# Patient Record
Sex: Male | Born: 1970 | Race: White | Hispanic: No | Marital: Married | Smoking: Never smoker
Health system: Southern US, Community
[De-identification: ages and names within clinical notes are randomized; demographics above are authoritative.]

## PROBLEM LIST (undated history)

## (undated) DIAGNOSIS — F329 Major depressive disorder, single episode, unspecified: Secondary | ICD-10-CM

## (undated) DIAGNOSIS — I10 Essential (primary) hypertension: Secondary | ICD-10-CM

## (undated) DIAGNOSIS — F32A Depression, unspecified: Secondary | ICD-10-CM

## (undated) DIAGNOSIS — F419 Anxiety disorder, unspecified: Secondary | ICD-10-CM

## (undated) DIAGNOSIS — K219 Gastro-esophageal reflux disease without esophagitis: Secondary | ICD-10-CM

## (undated) DIAGNOSIS — G4730 Sleep apnea, unspecified: Secondary | ICD-10-CM

## (undated) DIAGNOSIS — N189 Chronic kidney disease, unspecified: Secondary | ICD-10-CM

## (undated) HISTORY — PX: COLONOSCOPY: SHX174

## (undated) HISTORY — PX: WISDOM TOOTH EXTRACTION: SHX21

---
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
## 2011-01-06 ENCOUNTER — Ambulatory Visit: Payer: Self-pay | Admitting: Unknown Physician Specialty

## 2011-01-11 LAB — PATHOLOGY REPORT

## 2011-12-31 ENCOUNTER — Ambulatory Visit: Payer: Self-pay

## 2012-01-06 ENCOUNTER — Ambulatory Visit: Payer: Self-pay | Admitting: Internal Medicine

## 2013-05-15 ENCOUNTER — Ambulatory Visit: Payer: Self-pay

## 2013-11-13 ENCOUNTER — Ambulatory Visit: Payer: Self-pay | Admitting: Emergency Medicine

## 2013-11-13 LAB — URINALYSIS, COMPLETE
Bacteria: NEGATIVE
Bilirubin,UR: NEGATIVE
KETONE: NEGATIVE
LEUKOCYTE ESTERASE: NEGATIVE
Nitrite: NEGATIVE
Ph: 6.5 (ref 4.5–8.0)
Protein: NEGATIVE
Specific Gravity: 1.01 (ref 1.003–1.030)

## 2013-11-15 LAB — URINE CULTURE

## 2013-12-09 ENCOUNTER — Ambulatory Visit: Payer: Self-pay | Admitting: Urology

## 2014-01-12 DIAGNOSIS — R972 Elevated prostate specific antigen [PSA]: Secondary | ICD-10-CM | POA: Insufficient documentation

## 2014-01-12 DIAGNOSIS — R310 Gross hematuria: Secondary | ICD-10-CM | POA: Insufficient documentation

## 2014-03-24 DIAGNOSIS — M40204 Unspecified kyphosis, thoracic region: Secondary | ICD-10-CM | POA: Insufficient documentation

## 2014-03-24 DIAGNOSIS — M542 Cervicalgia: Secondary | ICD-10-CM | POA: Insufficient documentation

## 2014-10-15 IMAGING — CT CT ABDOMEN AND PELVIS WITHOUT AND WITH CONTRAST
2 of 7 series · 13 of 46 positions shown, 18 images · IV contrast (isovue)
Comparison: None.

CLINICAL DATA: Painless hematuria 2 weeks ago.

EXAM:
CT ABDOMEN AND PELVIS WITHOUT AND WITH CONTRAST
TECHNIQUE: Multidetector CT imaging of the abdomen and pelvis was performed
without contrast material in one or both body regions, followed by
contrast material(s) and further sections in one or both body
regions.
CONTRAST:  100 ml Isovue 370

[Series 6: hematuria < 45 with · axial · 0.98mm/px · z∈[-882,-447]mm · 10 of 103 slices shown, 15 images]
[im 8/103  soft-tissue]
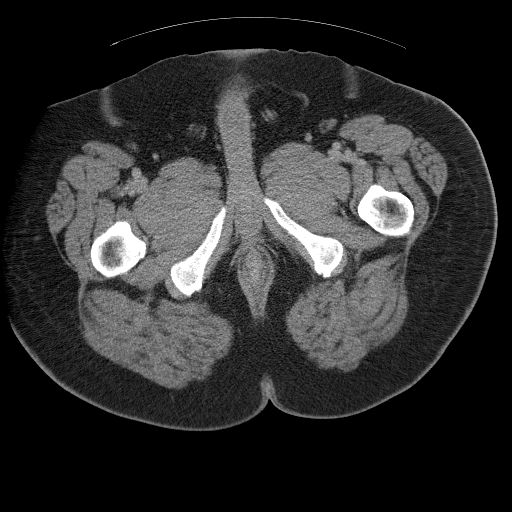
[im 8/103  bone]
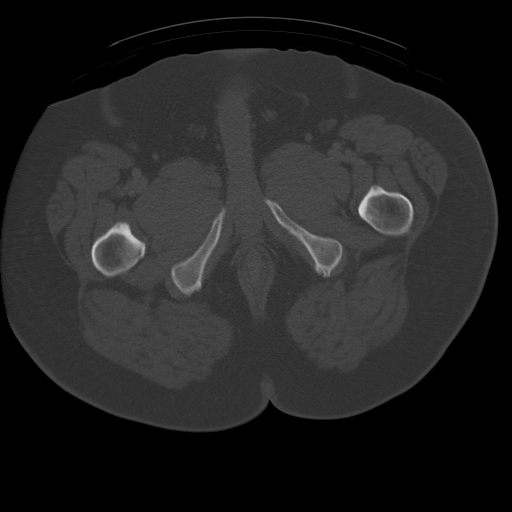
[im 22/103  soft-tissue]
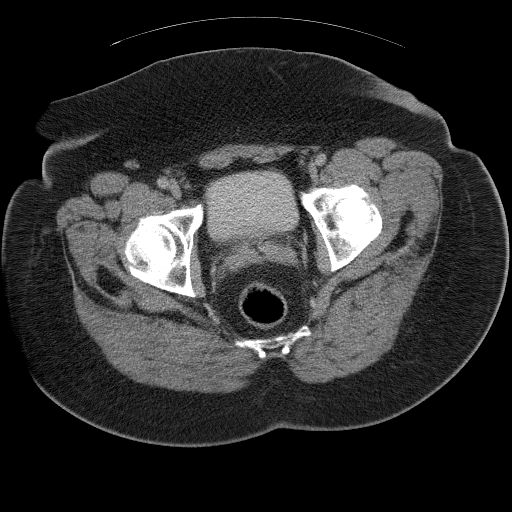
[im 30/103  soft-tissue]
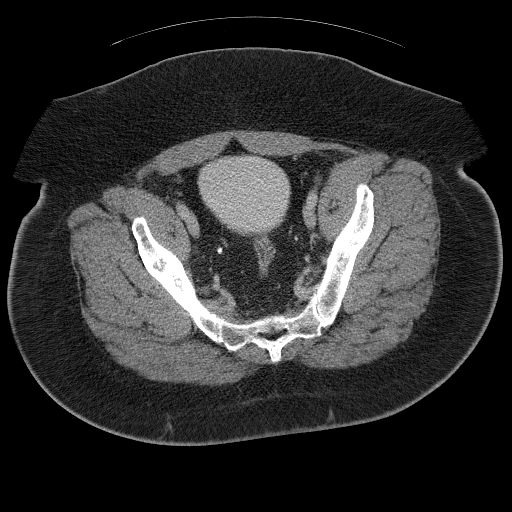
[im 44/103  soft-tissue]
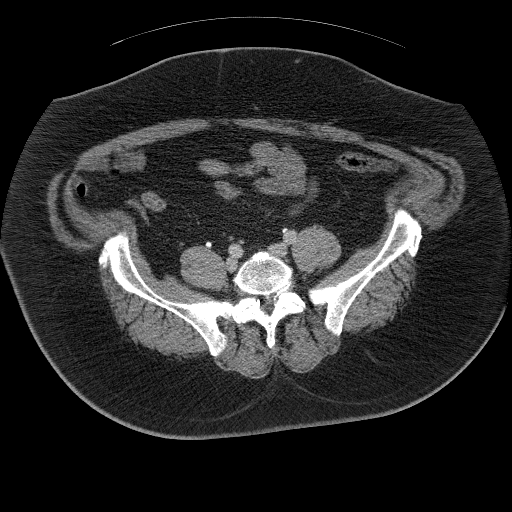
[im 52/103  soft-tissue]
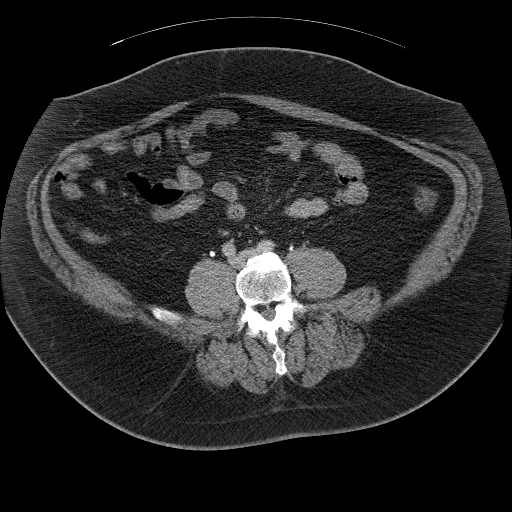
[im 59/103  soft-tissue]
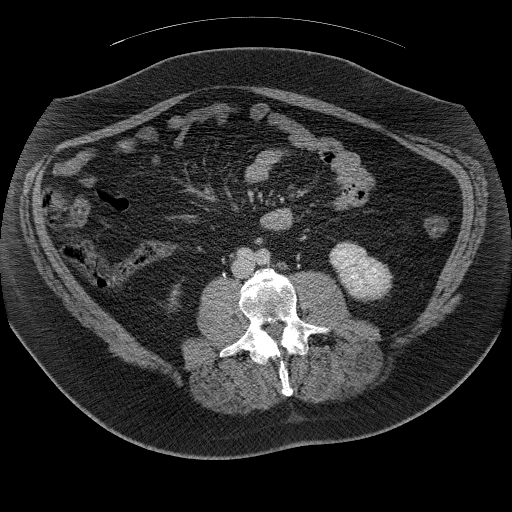
[im 73/103  soft-tissue]
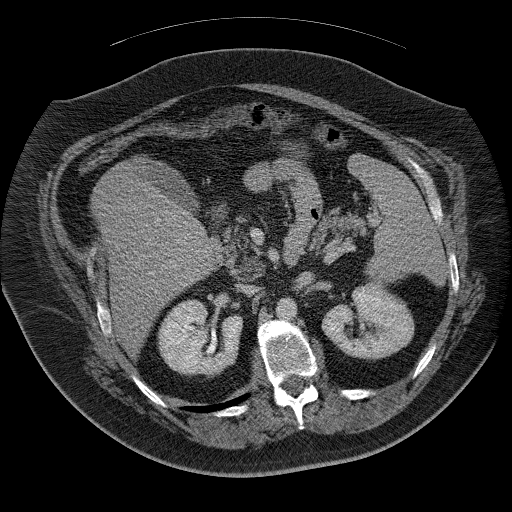
[im 73/103  lung]
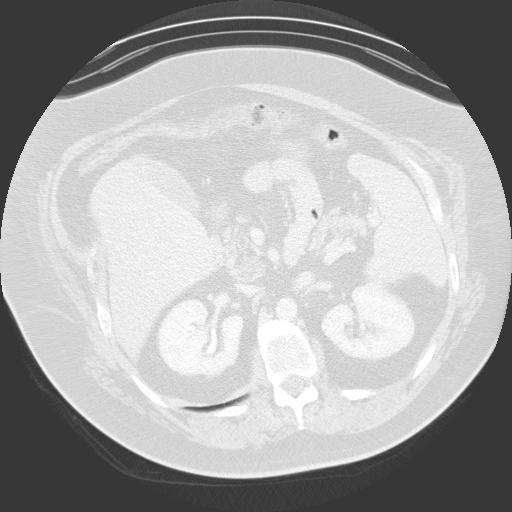
[im 81/103  soft-tissue]
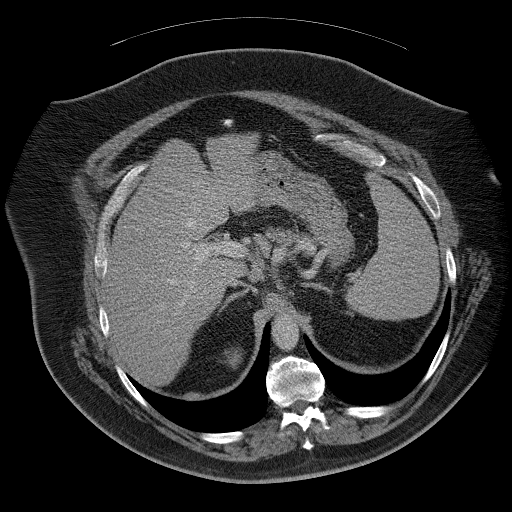
[im 81/103  lung]
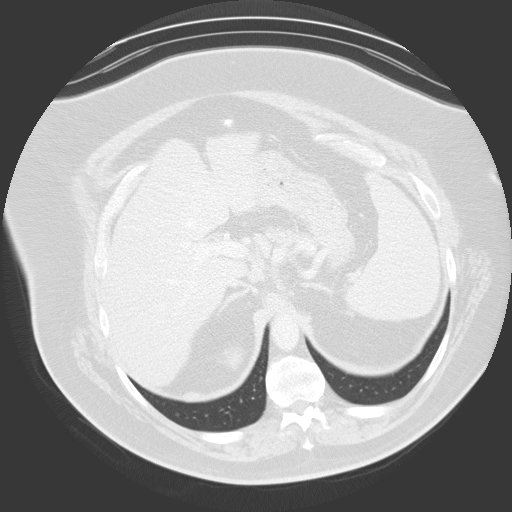
[im 88/103  lung]
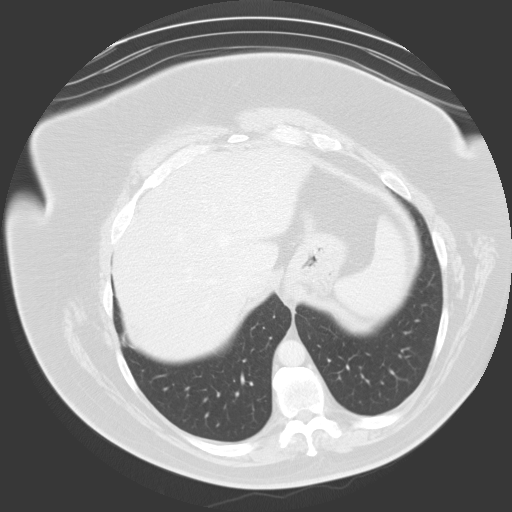
[im 95/103  soft-tissue]
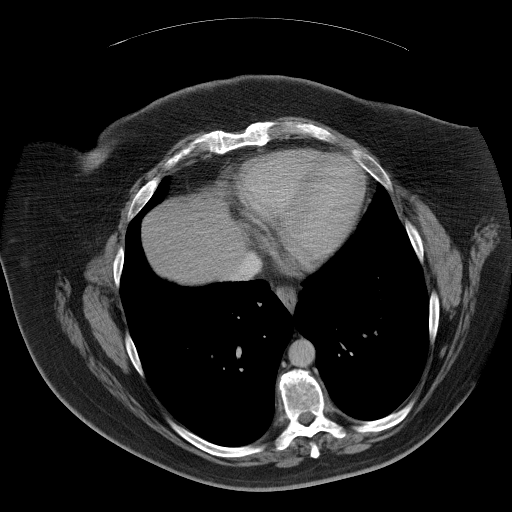
[im 95/103  lung]
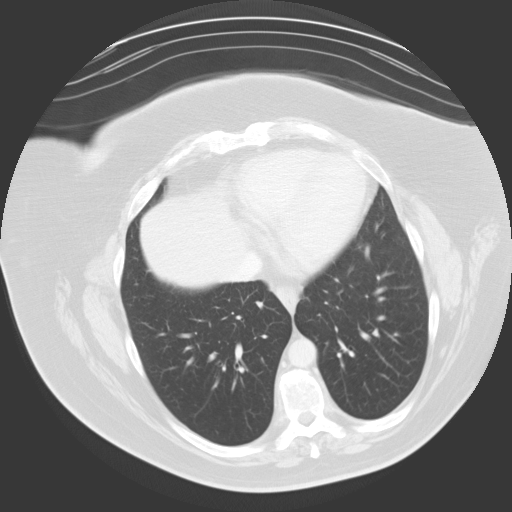
[im 95/103  bone]
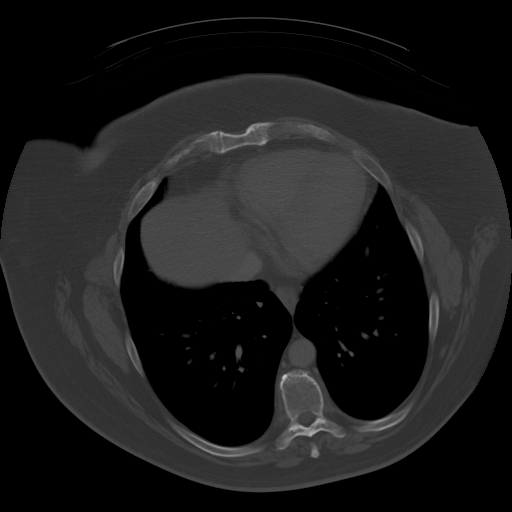

[Series 603: coronal without · coronal · non-contrast · 0.98mm/px · 3 of 227 slices shown]
[im 57/227  soft-tissue]
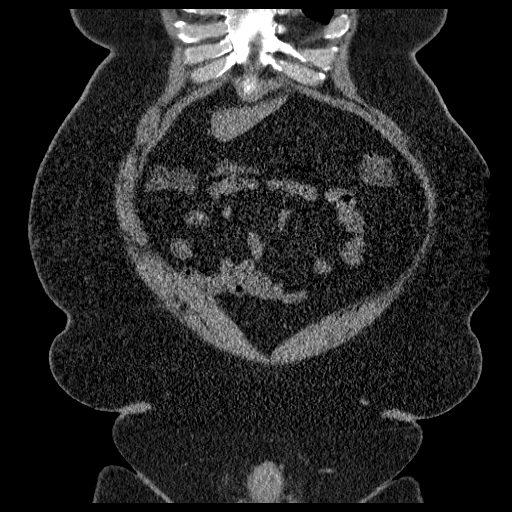
[im 114/227  soft-tissue]
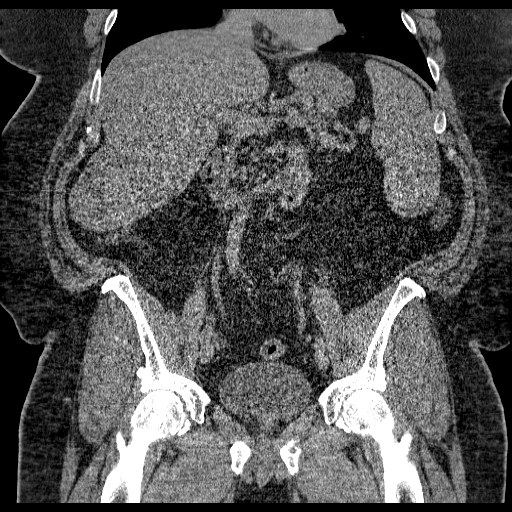
[im 170/227  soft-tissue]
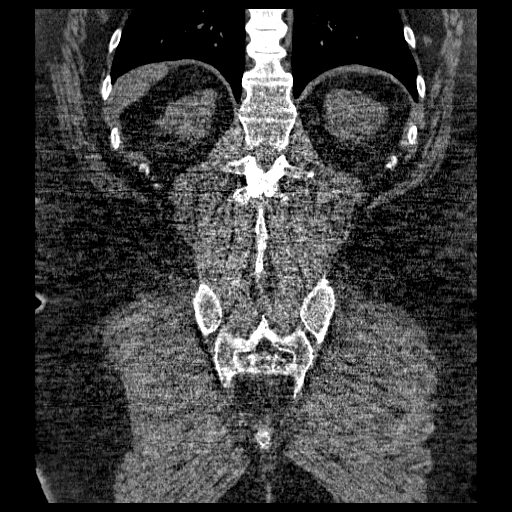

[13 of 46 positions shown; findings below may reference images not displayed]

FINDINGS: Examination is limited by body habitus.

Lung bases: Clear.  There is no pleural or pericardial effusion.

Kidneys / Ureters / Bladder: Pre-contrast images demonstrate no
renal, ureteral or bladder calculi. Post-contrast, imaging was
performed only in the delayed phase. No portal/nephrogram phase
imaging was obtained. Both kidneys enhance normally. There is no
evidence of renal mass. Delayed images result in segmental
visualization of the ureters. No urothelial abnormalities are
identified. The bladder appears unremarkable.

Other: The liver demonstrates steatosis without apparent focal
abnormality. The gallbladder, pancreas, spleen and adrenal glands
appear unremarkable.

No abnormalities of the stomach, small bowel, appendix or colon are
demonstrated. Small lymph nodes within the porta hepatis and
portacaval space are nonspecific, measuring 12 mm on images 23 and
25. There is no retroperitoneal or mesenteric lymphadenopathy. No
inflammatory changes are identified. No significant vascular
findings are seen. Small central prostatic calcifications are likely
dystrophic. A linear density is noted within the base of the penis,
only partially imaged on the delayed phase images. It is uncertain
as to whether this could reflect calcification or a possible foreign
body.

Bones / Musculoskeletal: There is apparent diffuse ankylosis of the
spine and sacroiliac joints suspicious for ankylosing spondylitis.
No acute osseous findings identified.
IMPRESSION: 1. No clear explanation for hematuria identified. There is no
evidence of urinary tract calculus.
2. A small foreign body within the base of the penis cannot be
excluded by this examination. This is incompletely visualized and
could reflect calcification.
3. Hepatic steatosis with probable reactive adenopathy in the porta
hepatis.
4. Underlying osseous changes consistent with ankylosing
spondylitis.

## 2015-12-09 DIAGNOSIS — Z87898 Personal history of other specified conditions: Secondary | ICD-10-CM | POA: Insufficient documentation

## 2018-01-08 DIAGNOSIS — I10 Essential (primary) hypertension: Secondary | ICD-10-CM | POA: Insufficient documentation

## 2018-01-08 DIAGNOSIS — M62838 Other muscle spasm: Secondary | ICD-10-CM | POA: Insufficient documentation

## 2018-01-08 DIAGNOSIS — K056 Periodontal disease, unspecified: Secondary | ICD-10-CM | POA: Insufficient documentation

## 2018-01-30 ENCOUNTER — Ambulatory Visit
Admission: RE | Admit: 2018-01-30 | Discharge: 2018-01-30 | Disposition: A | Discharge status: Home / Self Care | Payer: BLUE CROSS/BLUE SHIELD | Source: Ambulatory Visit | Attending: Family Medicine | Admitting: Family Medicine

## 2018-01-30 ENCOUNTER — Other Ambulatory Visit: Payer: Self-pay | Admitting: Family Medicine

## 2018-01-30 DIAGNOSIS — M62838 Other muscle spasm: Secondary | ICD-10-CM

## 2018-01-30 DIAGNOSIS — M5030 Other cervical disc degeneration, unspecified cervical region: Secondary | ICD-10-CM | POA: Diagnosis not present

## 2018-01-30 DIAGNOSIS — I10 Essential (primary) hypertension: Secondary | ICD-10-CM | POA: Insufficient documentation

## 2018-02-13 ENCOUNTER — Encounter: Payer: Self-pay | Admitting: Physical Therapy

## 2018-02-13 ENCOUNTER — Ambulatory Visit: Payer: BLUE CROSS/BLUE SHIELD | Attending: Family Medicine | Admitting: Physical Therapy

## 2018-02-13 DIAGNOSIS — M542 Cervicalgia: Secondary | ICD-10-CM | POA: Insufficient documentation

## 2018-02-13 DIAGNOSIS — R293 Abnormal posture: Secondary | ICD-10-CM | POA: Diagnosis present

## 2018-02-13 DIAGNOSIS — M6281 Muscle weakness (generalized): Secondary | ICD-10-CM | POA: Insufficient documentation

## 2018-02-13 DIAGNOSIS — M436 Torticollis: Secondary | ICD-10-CM | POA: Diagnosis present

## 2018-02-16 NOTE — Therapy (Signed)
[facility] [address] [place], [place], [address] Phone: [phone]   Fax:  [phone]  Physical Therapy Evaluation  Patient Details  Name: Cody Green. MRN: [id number] Date of Birth: 03/14/1971 Referring Provider: Norman Clay   Encounter Date: 02/13/2018  PT End of Session - 02/16/18 1706    Visit Number  1    Number of Visits  8    Date for PT Re-Evaluation  04/10/18    PT Start Time  5427    PT Stop Time  0829    PT Time Calculation (min)  1317 min    Activity Tolerance  Patient limited by pain    Behavior During Therapy  [facility] for tasks assessed/performed       History reviewed. No pertinent past medical history.  History reviewed. No pertinent surgical history.  There were no vitals filed for this visit.     Pt. reports chronic h/o neck pain.       See HEP/ discussed posture      PT Education - 02/16/18 1706    Education Details  See HEP    Person(s) Educated  Patient    Methods  Explanation;Demonstration;Handout    Comprehension  Verbalized understanding;Returned demonstration          PT Long Term Goals - 02/16/18 1722      PT LONG TERM GOAL #1   Title  Pt. independent with HEP to increase cervical AROM 25% all planes of movement to improve pain-free mobility.      Baseline  Cervical AROM: R rotn. 22 deg./ L rotn. 24 deg./ R lat. flex. 15 deg./ L lat. flex. 18 deg./ flex. 24 deg./ ext. 16 deg. Significant cervical joint hypomobility/ stiffness all planes of movement.     Time  8    Period  Weeks    Status  New    Target Date  04/10/18      PT LONG TERM GOAL #2   Title  Pt. able to complete 1/2 day of work/ computer use with no increase c/o neck pain.      Baseline  3/10 neck pain at rest and increase pain at day progresses.     Time  8    Period  Weeks    Status  New    Target Date  04/10/18      PT LONG TERM GOAL #3   Title  Pt. will complete FOTO and improve to goal set to  improve daily functional mobility.      Baseline  see FOTO    Time  8    Period  Weeks    Status  New    Target Date  04/10/18      PT LONG TERM GOAL #4   Title  Pt. able to demonstrate proper upright cervical posture to improve pain-free mobility with daily tasks/ reaching.      Baseline  Poor posture/ significant forward head and rounded shoulder posture.     Time  8    Period  Weeks    Status  New    Target Date  04/10/18         Pt. is a pleasant 47 y/o male with chronic c/o neck pain. Pt. presents to PT with significant forward head/ rounded shoulder posture in sitting/ standing. Pt. reports has been sleeping for years in a recliner and works at a [employer] during the day. Pt. reports 3/10 neck pain (lower  L/R) at rest. Cervical AROM: R rotn. 22 deg./ L rotn. 24 deg./ R lat. flex. 15 deg./ L lat. flex. 18 deg./ flex. 24 deg./ ext. 16 deg. Significant cervical joint hypomobility/ stiffness all planes of movement. B UE AROM WFL and strength grossly 5/5 MMT. B grip strength: 124#. R shoulder capsule discomfort reported during sh. abd./ flexion in seated posture. Prone grade II-III PA mobs. to thoracic/cervical region (significant hypomobility). Pt. will benefit form skilled PT services to increase cervical AROM all planes/ upright posture to improve pain-free mobility with work-related tasks.       Patient will benefit from skilled therapeutic intervention in order to improve the following deficits and impairments:  Improper body mechanics, Pain, Decreased mobility, Increased muscle spasms, Impaired tone, Postural dysfunction, Hypomobility, Decreased strength, Decreased range of motion, Decreased activity tolerance, Decreased balance, Impaired flexibility  Visit Diagnosis: Cervicalgia  Stiffness of neck  Muscle weakness (generalized)  Abnormal posture     Problem List There are no active problems to display for this patient.  Pura Spice, PT, DPT # [phone] 02/16/2018,  5:28 PM  [facility] [address] [place], [place], [address] Phone: [phone]   Fax:  [phone]  Name: Cody Green. MRN: [id number] Date of Birth: [birth year]

## 2018-02-19 ENCOUNTER — Ambulatory Visit: Payer: BLUE CROSS/BLUE SHIELD | Admitting: Physical Therapy

## 2018-02-19 ENCOUNTER — Encounter: Payer: Self-pay | Admitting: Physical Therapy

## 2018-02-19 DIAGNOSIS — M542 Cervicalgia: Secondary | ICD-10-CM | POA: Diagnosis not present

## 2018-02-19 DIAGNOSIS — M436 Torticollis: Secondary | ICD-10-CM

## 2018-02-19 DIAGNOSIS — R293 Abnormal posture: Secondary | ICD-10-CM

## 2018-02-19 DIAGNOSIS — M6281 Muscle weakness (generalized): Secondary | ICD-10-CM

## 2018-02-19 NOTE — Therapy (Signed)
[facility] [address] [place], [place], [address] Phone: [phone]   Fax:  [phone]  Physical Therapy Treatment  Patient Details  Name: Cody Green. MRN: [id number] Date of Birth: 03/20/1971 Referring Provider: Norman Clay   Encounter Date: 02/19/2018  PT End of Session - 02/19/18 1029    Visit Number  2    Number of Visits  8    Date for PT Re-Evaluation  04/10/18    PT Start Time  0729    PT Stop Time  0834    PT Time Calculation (min)  65 min    Activity Tolerance  Patient limited by pain    Behavior During Therapy  [facility] for tasks assessed/performed       History reviewed. No pertinent past medical history.  History reviewed. No pertinent surgical history.  There were no vitals filed for this visit.  Subjective Assessment - 02/19/18 0843    Subjective  Pt. reports soreness from past tx. lingering for 2-3 days.  Pt reports compliance with  HEP and that UT stretches have been beneficial and allowed greater ROM.        Pertinent History  Difficulty looking up due to neck limitations.  Pt. sleeps in recliner due to congestion.      Patient Stated Goals  Decrease neck pain/ improve posture.     Currently in Pain?  Yes    Pain Score  3     Pain Location  Neck    Pain Orientation  Right;Left    Pain Descriptors / Indicators  Aching;Sore    Pain Type  Chronic pain    Pain Onset  More than a month ago    Pain Frequency  Constant        Manual:  Seated STM to UT and cervical region while MH was applied to contralateral side of where manual was being performed.   Supine manual cervical traction performed with 30sec hold x4 General cervical PROM with slight overpressure applied in all planes of movement without any increase in pain. Prone grade II-III PA mobs. (Unilateral/ Central) applied and significant hypomobile noted in thoracic spine. STM applied to thoracic region in supine  Prone  Trigger point dry  needling to R UT region.  2 needles used in R UT trigger point (no muscle fasciculations noted).  Significant subcutaneous tissue present.  PT discussed upright posture at work/ use of 1/2 bolster at home.         PT Long Term Goals - 02/16/18 1722      PT LONG TERM GOAL #1   Title  Pt. independent with HEP to increase cervical AROM 25% all planes of movement to improve pain-free mobility.      Baseline  Cervical AROM: R rotn. 22 deg./ L rotn. 24 deg./ R lat. flex. 15 deg./ L lat. flex. 18 deg./ flex. 24 deg./ ext. 16 deg. Significant cervical joint hypomobility/ stiffness all planes of movement.     Time  8    Period  Weeks    Status  New    Target Date  04/10/18      PT LONG TERM GOAL #2   Title  Pt. able to complete 1/2 day of work/ computer use with no increase c/o neck pain.      Baseline  3/10 neck pain at rest and increase pain at day progresses.     Time  8    Period  Weeks  Status  New    Target Date  04/10/18      PT LONG TERM GOAL #3   Title  Pt. will complete FOTO and improve to goal set to improve daily functional mobility.      Baseline  see FOTO    Time  8    Period  Weeks    Status  New    Target Date  04/10/18      PT LONG TERM GOAL #4   Title  Pt. able to demonstrate proper upright cervical posture to improve pain-free mobility with daily tasks/ reaching.      Baseline  Poor posture/ significant forward head and rounded shoulder posture.     Time  8    Period  Weeks    Status  New    Target Date  04/10/18            Plan - 02/19/18 1030    Clinical Impression Statement  Tx. session consisted of primarily manual therapy in order to provide relief to UT and cervical musculature and to increase ROM of cervical spine.  Pt reports STM tends to assist with pain modulation in cervical region.  Pt. reported that supine half bolster was aggravating at first but was able to tolerate and progressed to doing UE movements while on bolster without discomfort.        Clinical Presentation  Stable    Clinical Decision Making  Low    Rehab Potential  Fair    PT Frequency  1x / week    PT Duration  8 weeks    PT Treatment/Interventions  ADLs/Self Care Home Management;Cryotherapy;Moist Heat;Functional mobility training;Therapeutic activities;Therapeutic exercise;Patient/family education;Neuromuscular re-education;Manual techniques;Dry needling;Passive range of motion    PT Next Visit Plan  Reassess/ progress HEP.      PT Home Exercise Plan  See handouts.        Patient will benefit from skilled therapeutic intervention in order to improve the following deficits and impairments:  Improper body mechanics, Pain, Decreased mobility, Increased muscle spasms, Impaired tone, Postural dysfunction, Hypomobility, Decreased strength, Decreased range of motion, Decreased activity tolerance, Decreased balance, Impaired flexibility  Visit Diagnosis: Cervicalgia  Stiffness of neck  Muscle weakness (generalized)  Abnormal posture     Problem List There are no active problems to display for this patient.  Pura Spice, PT, DPT # [id number] [place], SPT 02/19/2018, 5:08 PM  [facility] [address] [place], [place], [address] Phone: [phone]   Fax:  [phone]  Name: Cody Green. MRN: [id number] Date of Birth: November 03, 1970

## 2018-02-25 ENCOUNTER — Ambulatory Visit: Payer: BLUE CROSS/BLUE SHIELD | Admitting: Physical Therapy

## 2018-02-25 ENCOUNTER — Encounter: Payer: Self-pay | Admitting: Physical Therapy

## 2018-02-25 DIAGNOSIS — M542 Cervicalgia: Secondary | ICD-10-CM | POA: Diagnosis not present

## 2018-02-25 DIAGNOSIS — M6281 Muscle weakness (generalized): Secondary | ICD-10-CM

## 2018-02-25 DIAGNOSIS — M436 Torticollis: Secondary | ICD-10-CM

## 2018-02-25 DIAGNOSIS — R293 Abnormal posture: Secondary | ICD-10-CM

## 2018-02-25 NOTE — Therapy (Signed)
[facility] [address] [place], [place], [address] Phone: [phone]   Fax:  [phone]  Physical Therapy Treatment  Patient Details  Name: Cody Green. MRN: [id number] Date of Birth: 1970/11/20 Referring Provider: Norman Clay   Encounter Date: 02/25/2018  PT End of Session - 02/25/18 0922    Visit Number  3    Number of Visits  8    Date for PT Re-Evaluation  04/10/18    PT Start Time  0732    PT Stop Time  0823    PT Time Calculation (min)  51 min    Activity Tolerance  Patient limited by pain    Behavior During Therapy  [facility] for tasks assessed/performed       History reviewed. No pertinent past medical history.  History reviewed. No pertinent surgical history.  There were no vitals filed for this visit.  Subjective Assessment - 02/25/18 0917    Subjective  Pt. reports to have felt "much better" and could move better and stand taller following tx. session last week.  Pt. states that it lasted 2 days before sx. started again and he was unable to move as freely.  Pt. ordered half-bolster and it arrived at home yesterday and he reports that he is excited to start using it for activities at home.    Pertinent History  Difficulty looking up due to neck limitations.  Pt. sleeps in recliner due to congestion.      Patient Stated Goals  Decrease neck pain/ improve posture.     Currently in Pain?  No/denies        Treatment:  There Ex:  Supine on Bolster deep breathing 2x30sec Supine on Bolster T's 2x15 Supine on bolster Y's with use of cane 2x15 (c/o pain without use of cane) Scapular retractions on Nautilus 20# 2x15 Serratus wall slides x5 (unable to have full control of scapula in order to perform correctly)   Manual:  Supine cervical lateral flexion stretch 4x30sec Supine cervical rotation 4x30sec Supine UT stretch 4x30sec Supine Levator stretch 4x30sec Prone Grade I-II PA's in thoracic region with MH  placed on cervical spine Prone Grade I-II Unilaterals in thoracic region with MH place on cervical spine Prone Grade I-II PA's in cervical region with MH placed on thoracic spine Prone Grade I-II Unilaterals in cervical region with MH place on thoracic spine     PT Long Term Goals - 02/16/18 1722      PT LONG TERM GOAL #1   Title  Pt. independent with HEP to increase cervical AROM 25% all planes of movement to improve pain-free mobility.      Baseline  Cervical AROM: R rotn. 22 deg./ L rotn. 24 deg./ R lat. flex. 15 deg./ L lat. flex. 18 deg./ flex. 24 deg./ ext. 16 deg. Significant cervical joint hypomobility/ stiffness all planes of movement.     Time  8    Period  Weeks    Status  New    Target Date  04/10/18      PT LONG TERM GOAL #2   Title  Pt. able to complete 1/2 day of work/ computer use with no increase c/o neck pain.      Baseline  3/10 neck pain at rest and increase pain at day progresses.     Time  8    Period  Weeks    Status  New    Target Date  04/10/18  PT LONG TERM GOAL #3   Title  Pt. will complete FOTO and improve to goal set to improve daily functional mobility.      Baseline  see FOTO    Time  8    Period  Weeks    Status  New    Target Date  04/10/18      PT LONG TERM GOAL #4   Title  Pt. able to demonstrate proper upright cervical posture to improve pain-free mobility with daily tasks/ reaching.      Baseline  Poor posture/ significant forward head and rounded shoulder posture.     Time  8    Period  Weeks    Status  New    Target Date  04/10/18            Plan - 02/25/18 0938    Clinical Impression Statement  Pt. is repsonding well to manual therapy and reports to be gaining movement and more upright posture after tx's.  Pt. educated on importance of HEP and movement while he is feeling better to keep the motion gained after tx. sessions.  Pt. able to progress to isometric contractions of cervical lateral flexion and cervical rotation  with mild discomfort.      Clinical Presentation  Stable    Clinical Decision Making  Low    Rehab Potential  Fair    PT Frequency  1x / week    PT Duration  8 weeks    PT Treatment/Interventions  ADLs/Self Care Home Management;Cryotherapy;Moist Heat;Functional mobility training;Therapeutic activities;Therapeutic exercise;Patient/family education;Neuromuscular re-education;Manual techniques;Dry needling;Passive range of motion    PT Next Visit Plan  Assess bolster activities pt. is doing at home/  Reasses ROM of cervical spine.    PT Home Exercise Plan  See handouts.        Patient will benefit from skilled therapeutic intervention in order to improve the following deficits and impairments:  Improper body mechanics, Pain, Decreased mobility, Increased muscle spasms, Impaired tone, Postural dysfunction, Hypomobility, Decreased strength, Decreased range of motion, Decreased activity tolerance, Decreased balance, Impaired flexibility  Visit Diagnosis: Cervicalgia  Stiffness of neck  Muscle weakness (generalized)  Abnormal posture     Problem List There are no active problems to display for this patient.  Pura Spice, PT, DPT # [id number] [place], SPT 02/25/2018, 10:23 AM  [facility] [address] [place], [place], [address] Phone: [phone]   Fax:  [phone]  Name: Cody Green. MRN: [id number] Date of Birth: [birth year]

## 2018-03-05 ENCOUNTER — Encounter: Payer: Self-pay | Admitting: Physical Therapy

## 2018-03-05 ENCOUNTER — Ambulatory Visit: Payer: BLUE CROSS/BLUE SHIELD | Admitting: Physical Therapy

## 2018-03-05 DIAGNOSIS — M542 Cervicalgia: Secondary | ICD-10-CM

## 2018-03-05 DIAGNOSIS — R293 Abnormal posture: Secondary | ICD-10-CM

## 2018-03-05 DIAGNOSIS — M436 Torticollis: Secondary | ICD-10-CM

## 2018-03-05 DIAGNOSIS — M6281 Muscle weakness (generalized): Secondary | ICD-10-CM

## 2018-03-05 NOTE — Therapy (Signed)
[facility] [address] [place], [place], [address] Phone: [phone]   Fax:  [phone]  Physical Therapy Treatment  Patient Details  Name: Cody Green. MRN: [id number] Date of Birth: [birth year] Referring Provider: Norman Clay   Encounter Date: 03/05/2018  PT End of Session - 03/05/18 0750    Visit Number  4    Number of Visits  8    Date for PT Re-Evaluation  04/10/18    PT Start Time  0731    PT Stop Time  0817    PT Time Calculation (min)  46 min    Activity Tolerance  Patient limited by pain    Behavior During Therapy  [facility] for tasks assessed/performed       History reviewed. No pertinent past medical history.  History reviewed. No pertinent surgical history.  There were no vitals filed for this visit.  Subjective Assessment - 03/05/18 0735    Subjective  Pt reports soreness over the last few days. Has not used bolster due to soreness. Massage on Wed last week provided some temporary relief.    Pertinent History  Difficulty looking up due to neck limitations.  Pt. sleeps in recliner due to congestion.      Patient Stated Goals  Decrease neck pain/ improve posture.     Currently in Pain?  No/denies    Pain Onset  --        Manual:  Supine lying on half foam roller Supine cervical lateral flexion stretch 4x30sec Supine cervical rotation 4x30sec Supine UT stretch 4x30sec Supine Levator stretch 4x30sec Supine B Shoulder AAROM with wand (flexion and lateral movement for trunk rotation) Prone Grade I-II PA's in thoracic region with MH placed on cervical spine Prone Grade I-II Unilaterals in thoracic region with MH place on cervical spine Prone Grade I-II PA's in cervical region with MH placed on thoracic spine Prone Grade I-II Unilaterals in cervical region with MH place on thoracic spine      PT Long Term Goals - 02/16/18 1722      PT LONG TERM GOAL #1   Title  Pt. independent with HEP to increase  cervical AROM 25% all planes of movement to improve pain-free mobility.      Baseline  Cervical AROM: R rotn. 22 deg./ L rotn. 24 deg./ R lat. flex. 15 deg./ L lat. flex. 18 deg./ flex. 24 deg./ ext. 16 deg. Significant cervical joint hypomobility/ stiffness all planes of movement.     Time  8    Period  Weeks    Status  New    Target Date  04/10/18      PT LONG TERM GOAL #2   Title  Pt. able to complete 1/2 day of work/ computer use with no increase c/o neck pain.      Baseline  3/10 neck pain at rest and increase pain at day progresses.     Time  8    Period  Weeks    Status  New    Target Date  04/10/18      PT LONG TERM GOAL #3   Title  Pt. will complete FOTO and improve to goal set to improve daily functional mobility.      Baseline  see FOTO    Time  8    Period  Weeks    Status  New    Target Date  04/10/18      PT LONG TERM GOAL #4  Title  Pt. able to demonstrate proper upright cervical posture to improve pain-free mobility with daily tasks/ reaching.      Baseline  Poor posture/ significant forward head and rounded shoulder posture.     Time  8    Period  Weeks    Status  New    Target Date  04/10/18            Plan - 03/05/18 0846    Clinical Impression Statement  Pt. responded well to heat and mobilizations during treatment today.  Pt. educated on importance of HEP and sleeping in bed rather than recliner.  Pt. verbalized understanding of education given and reports that he will work on progressing towards the bed.      Clinical Presentation  Stable    Clinical Decision Making  Low    Rehab Potential  Fair    PT Frequency  1x / week    PT Duration  8 weeks    PT Treatment/Interventions  ADLs/Self Care Home Management;Cryotherapy;Moist Heat;Functional mobility training;Therapeutic activities;Therapeutic exercise;Patient/family education;Neuromuscular re-education;Manual techniques;Dry needling;Passive range of motion    PT Next Visit Plan  Ask about sleeping  in bed vs. recliner/ Assess cervical ROM    PT Home Exercise Plan  See handouts.        Patient will benefit from skilled therapeutic intervention in order to improve the following deficits and impairments:  Improper body mechanics, Pain, Decreased mobility, Increased muscle spasms, Impaired tone, Postural dysfunction, Hypomobility, Decreased strength, Decreased range of motion, Decreased activity tolerance, Decreased balance, Impaired flexibility  Visit Diagnosis: Cervicalgia  Stiffness of neck  Muscle weakness (generalized)  Abnormal posture     Problem List There are no active problems to display for this patient.  Pura Spice, PT, DPT # [id number] [place], SPT 03/05/2018, 10:42 AM  [facility] [address] [place], [place], [address] Phone: [phone]   Fax:  [phone]  Name: Cody Green. MRN: [id number] Date of Birth: 03/14/1971

## 2018-03-12 ENCOUNTER — Encounter: Payer: Self-pay | Admitting: Physical Therapy

## 2018-03-12 ENCOUNTER — Ambulatory Visit: Payer: BLUE CROSS/BLUE SHIELD | Attending: Family Medicine | Admitting: Physical Therapy

## 2018-03-12 DIAGNOSIS — M542 Cervicalgia: Secondary | ICD-10-CM | POA: Insufficient documentation

## 2018-03-12 DIAGNOSIS — R293 Abnormal posture: Secondary | ICD-10-CM | POA: Diagnosis present

## 2018-03-12 DIAGNOSIS — M6281 Muscle weakness (generalized): Secondary | ICD-10-CM | POA: Diagnosis present

## 2018-03-12 DIAGNOSIS — M436 Torticollis: Secondary | ICD-10-CM | POA: Insufficient documentation

## 2018-03-12 NOTE — Therapy (Addendum)
[facility] [address] [place], [place], [address] Phone: [phone]   Fax:  [phone]  Physical Therapy Treatment  Patient Details  Name: Cody Green. MRN: [id number] Date of Birth: Jul 07, 1971 Referring Provider: Norman Clay   Encounter Date: 03/12/2018  PT End of Session - 03/12/18 0852    Visit Number  5    Number of Visits  8    Date for PT Re-Evaluation  04/10/18    PT Start Time  0732    PT Stop Time  0823    PT Time Calculation (min)  51 min    Activity Tolerance  Patient limited by pain    Behavior During Therapy  [facility] for tasks assessed/performed       History reviewed. No pertinent past medical history.  History reviewed. No pertinent surgical history.  There were no vitals filed for this visit.  Subjective Assessment - 03/12/18 0850    Subjective  Pt reports that he has not yet tried bolster. Stretches at home are providing temporary relief but not lasting. Has a plan to transition back to bed from recliner.    Pertinent History  Difficulty looking up due to neck limitations.  Pt. sleeps in recliner due to congestion.      Patient Stated Goals  Decrease neck pain/ improve posture.     Currently in Pain?  No/denies         TREATMENT   Supine: Passive stretches to B UT, levator scapulae 3 x 30 sec ea.; stretches to scalenes produced pain in posterior neck, discontinued Myofascial release to B UT, suboccipitals x 4 min Pin-and-stretch to pectoralis maj/min 2 x 30 sec ea Grade III-IV scapular mobilization into depression B 2 bouts of 20 sec ea Manual cervical traction 3 bouts of 20 sec ea with movements into flexion Low-load pectoral stretch x 5 min concurrent with traction, assessment of capital ROM  Assessment of capital ROM WNL except flexion painful near extremes in posterior neck  Seated: Myofascial release to B rhomboids, levator scapulae, middle and lower trapezius x 5 min; focal tightness  observed L>R in levator scapulae, middle trapezius near point of scapula  Prone: Grade III-IV PA mobilization to T2-T8 with focus at T4, T7 x 6 min  Sidelying R/L: Scapular PNF: rhythmic initiation with overpressure into depression x 20; stabilizing reversals with 5 sec holds x 5  Seated: Chin tucks attempted but discontinued following complaint of painful stretch in posterior neck Contract-relax stretch into flexion 3 bouts of 5 sec isometric hold followed by AROM; pain-free ROM improved noticeably.  Standing: Scapular rows, lat pull-down, external rotation 2 x 10 ea with green theraband Pt education on bracing     PT Education - 03/12/18 0851    Education Details  HEP    Person(s) Educated  Patient    Methods  Explanation;Demonstration;Tactile cues;Handout;Verbal cues    Comprehension  Verbalized understanding;Returned demonstration;Verbal cues required;Tactile cues required          PT Long Term Goals - 02/16/18 1722      PT LONG TERM GOAL #1   Title  Pt. independent with HEP to increase cervical AROM 25% all planes of movement to improve pain-free mobility.      Baseline  Cervical AROM: R rotn. 22 deg./ L rotn. 24 deg./ R lat. flex. 15 deg./ L lat. flex. 18 deg./ flex. 24 deg./ ext. 16 deg. Significant cervical joint hypomobility/ stiffness all planes of movement.  Time  8    Period  Weeks    Status  New    Target Date  04/10/18      PT LONG TERM GOAL #2   Title  Pt. able to complete 1/2 day of work/ computer use with no increase c/o neck pain.      Baseline  3/10 neck pain at rest and increase pain at day progresses.     Time  8    Period  Weeks    Status  New    Target Date  04/10/18      PT LONG TERM GOAL #3   Title  Pt. will complete FOTO and improve to goal set to improve daily functional mobility.      Baseline  see FOTO    Time  8    Period  Weeks    Status  New    Target Date  04/10/18      PT LONG TERM GOAL #4   Title  Pt. able to demonstrate  proper upright cervical posture to improve pain-free mobility with daily tasks/ reaching.      Baseline  Poor posture/ significant forward head and rounded shoulder posture.     Time  8    Period  Weeks    Status  New    Target Date  04/10/18          Plan - 03/12/18 1005    Clinical Impression Statement  Cervical ROM remains limited in all directions with tightness in posterior neck when looking down; capital ROM appears WNL although flexion limited by pain at end range. Improved with contract/relax stretching in availble ROM. Thoracic spine remains hypomobile throughout with worst hypomobility at T4 and T7 segments; pt able to tolerate grade 4 mobilization with limited improvement. Pt reports reduced tightness and improved ROM after treatments but demonstrates limited between-sessions change; strengthening and pt education on bracing initiated to improve carryover. Pt will benefit from skilled physical therapy to improve cervical ROM and normalize posture.    Clinical Presentation  Stable    Clinical Decision Making  Low    Rehab Potential  Fair    PT Frequency  1x / week    PT Duration  8 weeks    PT Treatment/Interventions  ADLs/Self Care Home Management;Cryotherapy;Moist Heat;Functional mobility training;Therapeutic activities;Therapeutic exercise;Patient/family education;Neuromuscular re-education;Manual techniques;Dry needling;Passive range of motion    PT Next Visit Plan  Assess bolster, sleeping, brace; scapular mobs; contract-relax in cervical ROM progressing toward chin tuck    PT Home Exercise Plan  Added lat pull downs, scap rows, external rot with green theraband    Consulted and Agree with Plan of Care  Patient       Patient will benefit from skilled therapeutic intervention in order to improve the following deficits and impairments:  Improper body mechanics, Pain, Decreased mobility, Increased muscle spasms, Impaired tone, Postural dysfunction, Hypomobility, Decreased  strength, Decreased range of motion, Decreased activity tolerance, Decreased balance, Impaired flexibility  Visit Diagnosis: Cervicalgia  Stiffness of neck  Muscle weakness (generalized)  Abnormal posture     Problem List There are no active problems to display for this patient.  Pura Spice, PT, DPT # [id number], SPT 03/12/2018, 2:37 PM  [facility] [address] [place], [place], [address] Phone: [phone]   Fax:  [phone]  Name: [name]. MRN: [id number] Date of Birth: 08/01/1971

## 2018-03-19 ENCOUNTER — Encounter: Payer: Self-pay | Admitting: Physical Therapy

## 2018-03-19 ENCOUNTER — Ambulatory Visit: Payer: BLUE CROSS/BLUE SHIELD | Admitting: Physical Therapy

## 2018-03-19 DIAGNOSIS — M542 Cervicalgia: Secondary | ICD-10-CM | POA: Diagnosis not present

## 2018-03-19 DIAGNOSIS — M436 Torticollis: Secondary | ICD-10-CM

## 2018-03-19 DIAGNOSIS — R293 Abnormal posture: Secondary | ICD-10-CM

## 2018-03-19 DIAGNOSIS — M6281 Muscle weakness (generalized): Secondary | ICD-10-CM

## 2018-03-19 NOTE — Therapy (Signed)
[facility] [address] [place], [place], [address] Phone: [phone]   Fax:  [phone]  Physical Therapy Treatment  Patient Details  Name: Cody Green. MRN: [id number] Date of Birth: July 22, 1971 Referring Provider: Norman Clay   Encounter Date: 03/19/2018  PT End of Session - 03/19/18 0933    Visit Number  6    Number of Visits  8    Date for PT Re-Evaluation  04/10/18    PT Start Time  0742    PT Stop Time  0836    PT Time Calculation (min)  54 min    Activity Tolerance  Patient limited by pain    Behavior During Therapy  [facility] for tasks assessed/performed       History reviewed. No pertinent past medical history.  History reviewed. No pertinent surgical history.  There were no vitals filed for this visit.  Subjective Assessment - 03/19/18 0921    Subjective  Pt. reports that he has recently joined [employer] and has started going on a regular basis over the past week with wife.  Pt. reports that he has been able to stand up taller since last session and going to the gym.     Patient is accompained by:  Family member    Pertinent History  Difficulty looking up due to neck limitations.  Pt. sleeps in recliner due to congestion.      Patient Stated Goals  Decrease neck pain/ improve posture.     Currently in Pain?  No/denies         Treatment:  Manual  Supine Passive stretches to B U, levator scapulae 3 x 30 sec Supine Myofascial release to B UT, suboccipitals x 4 min Supine Manual Scapular Depression 2x30sec Supine Manual cervical traction 4x30 sec Supine Low-load pectoral stretch x5 min Seated STM to B rhomboids, levator scapulae, middle and lower trapezius    There Ex:  Supine Chin tucks 2x10 Supine AROM wih overpressure of B UE (abduction, flexion) 2x10 Seated Chin tucks 2x10 Cervical Towel Snags 4x30sec Education on specific UE questions for gym exercises       PT Education -  03/19/18 0930    Education Details  Using towel for cervical snags    Person(s) Educated  Patient;Spouse    Methods  Explanation;Demonstration;Verbal cues    Comprehension  Verbalized understanding;Returned demonstration          PT Long Term Goals - 02/16/18 1722      PT LONG TERM GOAL #1   Title  Pt. independent with HEP to increase cervical AROM 25% all planes of movement to improve pain-free mobility.      Baseline  Cervical AROM: R rotn. 22 deg./ L rotn. 24 deg./ R lat. flex. 15 deg./ L lat. flex. 18 deg./ flex. 24 deg./ ext. 16 deg. Significant cervical joint hypomobility/ stiffness all planes of movement.     Time  8    Period  Weeks    Status  New    Target Date  04/10/18      PT LONG TERM GOAL #2   Title  Pt. able to complete 1/2 day of work/ computer use with no increase c/o neck pain.      Baseline  3/10 neck pain at rest and increase pain at day progresses.     Time  8    Period  Weeks    Status  New    Target Date  04/10/18  PT LONG TERM GOAL #3   Title  Pt. will complete FOTO and improve to goal set to improve daily functional mobility.      Baseline  see FOTO    Time  8    Period  Weeks    Status  New    Target Date  04/10/18      PT LONG TERM GOAL #4   Title  Pt. able to demonstrate proper upright cervical posture to improve pain-free mobility with daily tasks/ reaching.      Baseline  Poor posture/ significant forward head and rounded shoulder posture.     Time  8    Period  Weeks    Status  New    Target Date  04/10/18         Plan - 03/19/18 0933    Clinical Impression Statement  Pt. reports no pain on consistent basis upon arrival to clinic.  Pain management has been beneficial, with focus now shifting to strengthening and movement based exercises.  Pt. tolerated STM and stretching to UT and cervical region well, with increased ROM afterwards.  Pt. also instructed on using towle for home to perform cervical snags.  Pt. will continue to benefit  from therapy to improve cervical ROM and normalize posture.    Clinical Presentation  Stable    Clinical Decision Making  Low    Rehab Potential  Fair    PT Frequency  1x / week    PT Duration  8 weeks    PT Treatment/Interventions  ADLs/Self Care Home Management;Cryotherapy;Moist Heat;Functional mobility training;Therapeutic activities;Therapeutic exercise;Patient/family education;Neuromuscular re-education;Manual techniques;Dry needling;Passive range of motion    PT Next Visit Plan  Assess gym routine and recommend exercises.    PT Home Exercise Plan  Added cervical snags with towel.       Patient will benefit from skilled therapeutic intervention in order to improve the following deficits and impairments:  Improper body mechanics, Pain, Decreased mobility, Increased muscle spasms, Impaired tone, Postural dysfunction, Hypomobility, Decreased strength, Decreased range of motion, Decreased activity tolerance, Decreased balance, Impaired flexibility  Visit Diagnosis: Cervicalgia  Stiffness of neck  Muscle weakness (generalized)  Abnormal posture     Problem List There are no active problems to display for this patient.  Pura Spice, PT, DPT # [id number] Gwenlyn Saran, SPT 03/19/2018, 10:43 AM  [facility] [address] [place], [place], [address] Phone: [phone]   Fax:  [phone]  Name: Cody Green. MRN: [id number] Date of Birth: 09/22/1970

## 2018-03-26 ENCOUNTER — Encounter: Payer: Self-pay | Admitting: Physical Therapy

## 2018-03-26 ENCOUNTER — Ambulatory Visit: Payer: BLUE CROSS/BLUE SHIELD | Admitting: Physical Therapy

## 2018-03-26 DIAGNOSIS — R293 Abnormal posture: Secondary | ICD-10-CM

## 2018-03-26 DIAGNOSIS — M542 Cervicalgia: Secondary | ICD-10-CM | POA: Diagnosis not present

## 2018-03-26 DIAGNOSIS — M436 Torticollis: Secondary | ICD-10-CM

## 2018-03-26 DIAGNOSIS — M6281 Muscle weakness (generalized): Secondary | ICD-10-CM

## 2018-03-26 NOTE — Therapy (Signed)
[facility] [address] [place], [place], [address] Phone: [phone]   Fax:  [phone]  Physical Therapy Treatment  Patient Details  Name: Cody Green. MRN: [id number] Date of Birth: October 12, 1970 Referring Provider: Norman Clay   Encounter Date: 03/26/2018  PT End of Session - 03/26/18 0741    Visit Number  7    Number of Visits  8    Date for PT Re-Evaluation  04/10/18    PT Start Time  0729    PT Stop Time  0824    PT Time Calculation (min)  55 min    Activity Tolerance  No increased pain;Patient tolerated treatment well    Behavior During Therapy  [facility] for tasks assessed/performed       History reviewed. No pertinent past medical history.  History reviewed. No pertinent surgical history.  There were no vitals filed for this visit.  Subjective Assessment - 03/26/18 0735    Subjective  Pt. reports that he has been going to the gym 4x/week focusing on the UEs.  Pt. reports pain level has decreased over the time that he has been going to the gym.  Pt. reports he has been using the bolster everyday for short periods of time.    Patient is accompained by:  Family member    Pertinent History  Difficulty looking up due to neck limitations.  Pt. sleeps in recliner due to congestion.      Patient Stated Goals  Decrease neck pain/ improve posture.     Currently in Pain?  No/denies         Treatment:   Manual Supine Passive stretches to B U, levator scapulae 3 x 30 sec Supine Myofascial release to B UT, suboccipitals x 4 min Supine Manual Scapular Depression 2x30sec Supine Manual cervical traction 4x30 sec Supine Low-load pectoral stretch x5 min     There Ex:   Supine Chin tucks 2x10 Supine AROM wih overpressure of B UE (abduction, flexion) 2x10 Nautilus 60# Scapular Retraction 2x15 Nautilus 60# Chest Press 2x15 Nautilus 60# Lat Pull Down 2x15     PT Long Term Goals - 02/16/18 1722      PT LONG TERM GOAL  #1   Title  Pt. independent with HEP to increase cervical AROM 25% all planes of movement to improve pain-free mobility.      Baseline  Cervical AROM: R rotn. 22 deg./ L rotn. 24 deg./ R lat. flex. 15 deg./ L lat. flex. 18 deg./ flex. 24 deg./ ext. 16 deg. Significant cervical joint hypomobility/ stiffness all planes of movement.     Time  8    Period  Weeks    Status  New    Target Date  04/10/18      PT LONG TERM GOAL #2   Title  Pt. able to complete 1/2 day of work/ computer use with no increase c/o neck pain.      Baseline  3/10 neck pain at rest and increase pain at day progresses.     Time  8    Period  Weeks    Status  New    Target Date  04/10/18      PT LONG TERM GOAL #3   Title  Pt. will complete FOTO and improve to goal set to improve daily functional mobility.      Baseline  see FOTO    Time  8    Period  Weeks    Status  New    Target Date  04/10/18      PT LONG TERM GOAL #4   Title  Pt. able to demonstrate proper upright cervical posture to improve pain-free mobility with daily tasks/ reaching.      Baseline  Poor posture/ significant forward head and rounded shoulder posture.     Time  8    Period  Weeks    Status  New    Target Date  04/10/18          Plan - 03/26/18 0741    Clinical Impression Statement  Pt. making significant progress and has decreased pain levels since going to local gym for strengthening.  Pt. able to demonstrate some of the main exercises on nautilus that pt. performs in gym.  Pt. needed verbal cueing initially for relaxation of shoulders (decrease trap activation) during scapular retraction and was able to perform second set with no verbal cueing.  Pt. to continue use of bolster in order to open spine and increase mobilty.  Pt. performing well and will continue with strengthening program in the clinic during treatment times.    Clinical Presentation  Stable    Clinical Decision Making  Low    Rehab Potential  Fair    PT Frequency  1x /  week    PT Duration  8 weeks    PT Treatment/Interventions  ADLs/Self Care Home Management;Cryotherapy;Moist Heat;Functional mobility training;Therapeutic activities;Therapeutic exercise;Patient/family education;Neuromuscular re-education;Manual techniques;Dry needling;Passive range of motion    PT Next Visit Plan  REASSESS GOALS.    PT Home Exercise Plan  Continue performing exercises at local gym and using bolster.       Patient will benefit from skilled therapeutic intervention in order to improve the following deficits and impairments:  Improper body mechanics, Pain, Decreased mobility, Increased muscle spasms, Impaired tone, Postural dysfunction, Hypomobility, Decreased strength, Decreased range of motion, Decreased activity tolerance, Decreased balance, Impaired flexibility  Visit Diagnosis: Cervicalgia  Stiffness of neck  Muscle weakness (generalized)  Abnormal posture     Problem List There are no active problems to display for this patient.  Pura Spice, PT, DPT # [id number] Gwenlyn Saran, SPT 03/26/2018, 8:38 AM  [facility] [address] [place], [place], [address] Phone: [phone]   Fax:  [phone]  Name: Cody Green. MRN: [id number] Date of Birth: [birth year]

## 2018-04-02 ENCOUNTER — Encounter: Payer: Self-pay | Admitting: Physical Therapy

## 2018-04-02 ENCOUNTER — Ambulatory Visit: Payer: BLUE CROSS/BLUE SHIELD

## 2018-04-02 DIAGNOSIS — R293 Abnormal posture: Secondary | ICD-10-CM

## 2018-04-02 DIAGNOSIS — M436 Torticollis: Secondary | ICD-10-CM

## 2018-04-02 DIAGNOSIS — M542 Cervicalgia: Secondary | ICD-10-CM

## 2018-04-02 DIAGNOSIS — M6281 Muscle weakness (generalized): Secondary | ICD-10-CM

## 2018-04-02 NOTE — Therapy (Cosign Needed)
[facility] [address] [place], [place], [address] Phone: [phone]   Fax:  [phone]  Physical Therapy Treatment/Recertification/Progress Note  Dates of reporting period  02/13/18   to   04/02/18  Patient Details  Name: Cody Green. MRN: [id number] Date of Birth: 04-Oct-1970 Referring Provider: Norman Clay   Encounter Date: 04/02/2018  PT End of Session - 04/02/18 0805    Visit Number  8    Number of Visits  12    Date for PT Re-Evaluation  04/30/18    PT Start Time  0801    PT Stop Time  3335    PT Time Calculation (min)  46 min    Activity Tolerance  No increased pain;Patient tolerated treatment well    Behavior During Therapy  [facility] for tasks assessed/performed       History reviewed. No pertinent past medical history.  History reviewed. No pertinent surgical history.  There were no vitals filed for this visit.  Subjective Assessment - 04/02/18 0803    Subjective  Pt. reports he is feeling much more stiff this week, but is still going to the gym 3x a week.  Pt. reports he is getting stronger from the gym.    Patient is accompained by:  Family member    Pertinent History  Difficulty looking up due to neck limitations.  Pt. sleeps in recliner due to congestion.      Patient Stated Goals  Decrease neck pain/ improve posture.     Currently in Pain?  No/denies        Treatment:  Pt completed FOTO (unbilled);  Manual Obtained cervical ROM measures; Assessed sitting posture; SupinePassive stretches to B U, levator scapulae 3 x 30 sec SupineMyofascial release to B UT, suboccipitals x 4 min Supine Manual Scapular Depression 2x30sec SupineManual cervical traction4x30 sec SupineLow-load pectoral stretch x5 min   There Ex: Supine Chin tucks 2x10 Supine AROM wih overpressure of B UE (abduction, flexion) 2x10 Supine AROM with Dowel in B UE (Flexion) 2x10    Goal reassessment was performed today and  noted in section.   PT Education - 04/03/18 0822    Education Details  exercise form/technique and plan of care    Person(s) Educated  Patient    Methods  Explanation    Comprehension  Verbalized understanding          PT Long Term Goals - 04/02/18 4562      PT LONG TERM GOAL #1   Title  Pt. independent with HEP to increase cervical AROM 25% all planes of movement to improve pain-free mobility.    Baseline  Cervical AROM: R rotn. 22 deg./ L rotn. 24 deg./ R lat. flex. 15 deg./ L lat. flex. 18 deg./ flex. 24 deg./ ext. 16 deg. Significant cervical joint hypomobility/ stiffness all planes of movement. , 7/23 Cervical AROM: R rotn. 19 deg./ L rotn. 30 deg./ R lat. flex. 15 deg./ L lat. flex. 15 deg./ flex. 27 deg./ ext. 15 deg. Significant cervical joint hypomobility/ stiffness all planes of movement.     Time  8    Period  Weeks    Status  On-going    Target Date  04/10/18      PT LONG TERM GOAL #2   Title  Pt. able to complete 1/2 day of work/ computer use with no increase c/o neck pain.      Baseline  0/10 neck pain at rest and increases to 5/10  on worst day.    Time  8    Period  Weeks    Status  Partially Met    Target Date  04/10/18      PT LONG TERM GOAL #3   Title  Pt. will complete FOTO and improve to goal set to improve daily functional mobility.      Baseline  02/13/18: 39, FOTO: 41 on 7/23    Time  8    Period  Weeks    Status  On-going    Target Date  04/10/18      PT LONG TERM GOAL #4   Title  Pt. able to demonstrate proper upright cervical posture to improve pain-free mobility with daily tasks/ reaching.      Baseline  Poor posture/ significant forward head and rounded shoulder posture. 7/23: unchanged    Time  8    Period  Weeks    Status  On-going            Plan - 04/02/18 0959    Clinical Impression Statement  Pt. has been making significant improvements in mobility and reporting decreased pain levels prior to todays session.  Pt. reports that this  past week has been difficult with movement however has still made a point to continue with going to the gym 3x a week.  Pt. goals were reassessed and has not made significant improvements based on prior readings.  Pt. was educated on being discharged and requested to stay on schedule to extend the POC in order to see improvements made in next few weeks.  Pt. reported that he was feeling much better last week about being discharged, however with the decline in function and mobility that occurred during the previous week, pt. and therapist agreed he would stay on case load for next few weeks to reassess goals again.  Pt. will continue to benefit from skilled therapy to decrease pain and increase pain-free mobility in cervical region necessary for proper posture.    Clinical Presentation  Stable    Clinical Decision Making  Low    Rehab Potential  Fair    PT Frequency  1x / week    PT Duration  4 weeks    PT Treatment/Interventions  ADLs/Self Care Home Management;Cryotherapy;Moist Heat;Functional mobility training;Therapeutic activities;Therapeutic exercise;Patient/family education;Neuromuscular re-education;Manual techniques;Dry needling;Passive range of motion    PT Next Visit Plan  Posture assessment, focal point with strengthening of UEs.    PT Home Exercise Plan  Continue performing exercises at local gym and using bolster.    Consulted and Agree with Plan of Care  Patient       Patient will benefit from skilled therapeutic intervention in order to improve the following deficits and impairments:  Improper body mechanics, Pain, Decreased mobility, Increased muscle spasms, Impaired tone, Postural dysfunction, Hypomobility, Decreased strength, Decreased range of motion, Decreased activity tolerance, Decreased balance, Impaired flexibility  Visit Diagnosis: Cervicalgia  Stiffness of neck  Muscle weakness (generalized)  Abnormal posture     Problem List There are no active problems to  display for this patient.   Gwenlyn Saran SPT Phillips Grout PT, DPT, GCS  Huprich,Jason 04/03/2018, 8:34 AM  [facility] [address] [place], [place], [address] Phone: [phone]   Fax:  [phone]  Name: Cody Green. MRN: [id number] Date of Birth: [birth year]

## 2018-04-11 ENCOUNTER — Encounter: Payer: Self-pay | Admitting: Physical Therapy

## 2018-04-11 ENCOUNTER — Ambulatory Visit: Payer: BLUE CROSS/BLUE SHIELD | Attending: Family Medicine | Admitting: Physical Therapy

## 2018-04-11 DIAGNOSIS — M436 Torticollis: Secondary | ICD-10-CM | POA: Diagnosis present

## 2018-04-11 DIAGNOSIS — R293 Abnormal posture: Secondary | ICD-10-CM | POA: Insufficient documentation

## 2018-04-11 DIAGNOSIS — M6281 Muscle weakness (generalized): Secondary | ICD-10-CM | POA: Insufficient documentation

## 2018-04-11 DIAGNOSIS — M542 Cervicalgia: Secondary | ICD-10-CM | POA: Diagnosis present

## 2018-04-11 NOTE — Therapy (Signed)
[facility] [address] [place], [place], [address] Phone: [phone]   Fax:  [phone]  Physical Therapy Treatment  Patient Details  Name: Cody Green. MRN: [id number] Date of Birth: August 09, 1971 Referring Provider: Norman Clay   Encounter Date: 04/11/2018  PT End of Session - 04/11/18 0754    Visit Number  9    Number of Visits  12    Date for PT Re-Evaluation  04/30/18    PT Start Time  0734    PT Stop Time  0829    PT Time Calculation (min)  55 min    Activity Tolerance  No increased pain;Patient tolerated treatment well    Behavior During Therapy  [facility] for tasks assessed/performed       History reviewed. No pertinent past medical history.  History reviewed. No pertinent surgical history.  There were no vitals filed for this visit.  Subjective Assessment - 04/11/18 0752    Subjective  Pt. reports he has increased time in gym and has been going 4x a week now.  Pt. reports that he will be visiting MD on 8/16 for check-up.      Patient is accompained by:  Family member    Pertinent History  Difficulty looking up due to neck limitations.  Pt. sleeps in recliner due to congestion.      Patient Stated Goals  Decrease neck pain/ improve posture.     Currently in Pain?  No/denies    Pain Score  0-No pain    Pain Onset  More than a month ago          Treatment:   Manual Seated Passive stretches to B UT, levator scapulae 4x30 sec each (8 min) Prone Release to B UT, suboccipitals 4x30 sec each (8 min) Prone Grade II-III scapular mobilization B 4x30 each (4 min) Prone Grade II-III Unilateral/Central PA's to T1-T12 2x30 sec each (18 min)   There Ex: Supine AROM wih overpressure of B UE (abduction, flexion) 2x10 Supine B Shoulder AROM with bolster in horizontal placement at different spinal levels in thoracic region 2x15  Supine on blue mat hip flexor/ rectus muscle stretches        PT Long Term Goals -  04/11/18 1239      PT LONG TERM GOAL #1   Title  Pt. independent with HEP to increase cervical AROM 25% all planes of movement to improve pain-free mobility.    Baseline  Cervical AROM: R rotn. 22 deg./ L rotn. 24 deg./ R lat. flex. 15 deg./ L lat. flex. 18 deg./ flex. 24 deg./ ext. 16 deg. Significant cervical joint hypomobility/ stiffness all planes of movement. , 7/23 Cervical AROM: R rotn. 19 deg./ L rotn. 30 deg./ R lat. flex. 15 deg./ L lat. flex. 15 deg./ flex. 27 deg./ ext. 15 deg. Significant cervical joint hypomobility/ stiffness all planes of movement.     Time  8    Period  Weeks    Status  On-going    Target Date  04/10/18      PT LONG TERM GOAL #2   Title  Pt. able to complete 1/2 day of work/ computer use with no increase c/o neck pain.      Baseline  0/10 neck pain at rest and increases to 5/10 on worst day.    Time  8    Period  Weeks    Status  Partially Met    Target Date  04/10/18  PT LONG TERM GOAL #3   Title  Pt. will complete FOTO and improve to goal set to improve daily functional mobility.      Baseline  02/13/18: 39, FOTO: 41 on 7/23    Time  8    Period  Weeks    Status  On-going    Target Date  04/10/18      PT LONG TERM GOAL #4   Title  Pt. able to demonstrate proper upright cervical posture to improve pain-free mobility with daily tasks/ reaching.      Baseline  Poor posture/ significant forward head and rounded shoulder posture. 7/23: unchanged    Time  8    Period  Weeks    Status  On-going    Target Date  04/10/18            Plan - 04/11/18 0754    Clinical Impression Statement  Pt. walking with more upright posture compared to prior visit, however still exhibits forward head/kyphotic posture.  Pt. responds well to manual therapy, specifically Grade II-III Unilateral/ PA mobilzations to thoracic region.  Pt. leaves clinic feeling better and is able to self correct posture without much verbal cueing.  Pt. has made signifcant progress in gym  time and consistency with attendence at local gym.  Pt. educated on importance of cervical motion and performing bolster exercises with horizontal placement instead of vertical.    Clinical Presentation  Stable    Clinical Decision Making  Low    Rehab Potential  Fair    PT Frequency  1x / week    PT Duration  4 weeks    PT Treatment/Interventions  ADLs/Self Care Home Management;Cryotherapy;Moist Heat;Functional mobility training;Therapeutic activities;Therapeutic exercise;Patient/family education;Neuromuscular re-education;Manual techniques;Dry needling;Passive range of motion    PT Next Visit Plan  Posture assessment, focal point with strengthening of UEs.    PT Home Exercise Plan  Continue performing exercises at local gym and using bolster.    Consulted and Agree with Plan of Care  Patient       Patient will benefit from skilled therapeutic intervention in order to improve the following deficits and impairments:  Improper body mechanics, Pain, Decreased mobility, Increased muscle spasms, Impaired tone, Postural dysfunction, Hypomobility, Decreased strength, Decreased range of motion, Decreased activity tolerance, Decreased balance, Impaired flexibility  Visit Diagnosis: Cervicalgia  Stiffness of neck  Muscle weakness (generalized)  Abnormal posture     Problem List There are no active problems to display for this patient.  Pura Spice, PT, DPT # [id number] [place], SPT 04/11/2018, 12:57 PM  [place] [address] [place], [place], [address] Phone: [phone]   Fax:  [phone]  Name: Cody Green. MRN: [id number] Date of Birth: June 21, 1971

## 2018-04-18 ENCOUNTER — Encounter: Payer: Self-pay | Admitting: Physical Therapy

## 2018-04-18 ENCOUNTER — Ambulatory Visit: Payer: BLUE CROSS/BLUE SHIELD | Admitting: Physical Therapy

## 2018-04-18 DIAGNOSIS — M542 Cervicalgia: Secondary | ICD-10-CM

## 2018-04-18 DIAGNOSIS — M436 Torticollis: Secondary | ICD-10-CM

## 2018-04-18 DIAGNOSIS — M6281 Muscle weakness (generalized): Secondary | ICD-10-CM

## 2018-04-18 DIAGNOSIS — R293 Abnormal posture: Secondary | ICD-10-CM

## 2018-04-18 NOTE — Therapy (Signed)
[facility] [address]. [place], [place], [address] Phone: [phone]   Fax:  [phone]  Physical Therapy Treatment  Patient Details  Name: Cody Green. MRN: [id number] Date of Birth: 1970-10-15 Referring Provider: Norman Clay   Encounter Date: 04/18/2018  PT End of Session - 04/18/18 0738    Visit Number  10    Number of Visits  12    Date for PT Re-Evaluation  04/30/18    PT Start Time  0726    PT Stop Time  0817    PT Time Calculation (min)  51 min    Activity Tolerance  No increased pain;Patient tolerated treatment well    Behavior During Therapy  [facility] for tasks assessed/performed       History reviewed. No pertinent past medical history.  History reviewed. No pertinent surgical history.  There were no vitals filed for this visit.  Subjective Assessment - 04/18/18 0731    Subjective  Pt. reports that he continues to be going to the gym in order to strengthen whole body.  Pt. is currrently working on cardio doing the stationary bike and elliptical.  Pt. also reports to be doing more UE exercises, lat pull downs, seated fly and rows.  Pt. reports that he will continue to focus on strengthening exercises.    Patient is accompained by:  Family member    Pertinent History  Difficulty looking up due to neck limitations.  Pt. sleeps in recliner due to congestion.      Patient Stated Goals  Decrease neck pain/ improve posture.     Currently in Pain?  No/denies    Pain Score  0-No pain    Pain Onset  More than a month ago         Treatment:   There Ex: Nautilus:   50# Lat Pull Down 2x15  50# Tricep Ext. 2x15  50# Scapular Retraction 2x15  50# Chest Press 2x15  50# B sh. Adduction 2x15  30# Internal Rotation x15  30# External Rotation x15   Manual Supine Passive stretches to B UT, levator scapulae 3x30 sec each (3 min) Supine Passive Cervical Rotation/ Lateral Flexion Stretches 8x30 sec (8  min)       Plan - 04/18/18 0739    Clinical Impression Statement  Pt. has made significant progress with     Rehab Potential  Fair    PT Frequency  1x / week    PT Duration  4 weeks    PT Treatment/Interventions  ADLs/Self Care Home Management;Cryotherapy;Moist Heat;Functional mobility training;Therapeutic activities;Therapeutic exercise;Patient/family education;Neuromuscular re-education;Manual techniques;Dry needling;Passive range of motion    PT Next Visit Plan  Posture assessment, focal point with strengthening of UEs.    PT Home Exercise Plan  Continue performing exercises at local gym and using bolster.    Consulted and Agree with Plan of Care  Patient       Patient will benefit from skilled therapeutic intervention in order to improve the following deficits and impairments:  Improper body mechanics, Pain, Decreased mobility, Increased muscle spasms, Impaired tone, Postural dysfunction, Hypomobility, Decreased strength, Decreased range of motion, Decreased activity tolerance, Decreased balance, Impaired flexibility  Visit Diagnosis: Cervicalgia  Stiffness of neck  Muscle weakness (generalized)  Abnormal posture     Problem List There are no active problems to display for this patient.  Pura Spice, PT, DPT # [id number] Gwenlyn Saran, SPT 04/18/2018, 7:45 AM  Media  [facility] [address] [place], [place], [address] Phone: [phone]   Fax:  [phone]  Name: Cody Green. MRN: [id number] Date of Birth: Apr 17, 1971

## 2018-05-16 ENCOUNTER — Ambulatory Visit: Payer: BLUE CROSS/BLUE SHIELD | Admitting: Physical Therapy

## 2018-06-13 ENCOUNTER — Ambulatory Visit: Payer: Self-pay | Admitting: Urology

## 2018-07-11 ENCOUNTER — Ambulatory Visit (INDEPENDENT_AMBULATORY_CARE_PROVIDER_SITE_OTHER): Payer: Self-pay | Admitting: Urology

## 2018-07-11 ENCOUNTER — Encounter: Payer: Self-pay | Admitting: Urology

## 2018-07-11 VITALS — BP 198/92 | HR 73 | Ht 72.0 in | Wt 370.0 lb

## 2018-07-11 DIAGNOSIS — E785 Hyperlipidemia, unspecified: Secondary | ICD-10-CM | POA: Insufficient documentation

## 2018-07-11 DIAGNOSIS — E119 Type 2 diabetes mellitus without complications: Secondary | ICD-10-CM | POA: Insufficient documentation

## 2018-07-11 DIAGNOSIS — K219 Gastro-esophageal reflux disease without esophagitis: Secondary | ICD-10-CM | POA: Insufficient documentation

## 2018-07-11 DIAGNOSIS — G4730 Sleep apnea, unspecified: Secondary | ICD-10-CM | POA: Insufficient documentation

## 2018-07-11 DIAGNOSIS — R21 Rash and other nonspecific skin eruption: Secondary | ICD-10-CM

## 2018-07-11 NOTE — Progress Notes (Signed)
07/11/2018 4:14 PM   Cody Shell Jr. 1971-06-06 [id number]  Referring provider: Clarisse Gouge, MD [address] [place],  [address]  Chief Complaint  Patient presents with  . Rash    HPI: 47 year old male seen by Dr. Vickki Muff in July 2019 with complaints of loss of pigmentation of his prepuce with pain/discomfort and fragile skin.  He was treated with clobetasol for lichen sclerosus and urology follow-up was recommended.  He states his discomfort resolved.  He has no voiding complaints.   PMH: No past medical history on file.  Surgical History: No past surgical history on file.  Home Medications:  Allergies as of 07/11/2018      Reactions   Triamcinolone Acetonide    Other reaction(s): Other (See Comments) nosebleeds      Medication List        Accurate as of 07/11/18  4:14 PM. Always use your most recent med list.          atorvastatin 20 MG tablet Commonly known as:  LIPITOR Take 20 mg by mouth daily.   B-D ULTRAFINE III SHORT PEN 31G X 8 MM Misc Generic drug:  Insulin Pen Needle See admin instructions.   benazepril 40 MG tablet Commonly known as:  LOTENSIN TAKE 1 TABLET (40 MG TOTAL) BY MOUTH ONCE DAILY (SUBSTITUTE FOR LOTREL)   clobetasol cream 0.05 % Commonly known as:  TEMOVATE Apply topically.   metFORMIN 500 MG tablet Commonly known as:  GLUCOPHAGE Take by mouth.   methocarbamol 750 MG tablet Commonly known as:  ROBAXIN Take 1,500 mg by mouth 3 (three) times daily.   nystatin powder Commonly known as:  MYCOSTATIN/NYSTOP APPLY TO AFFECTED AREA TWICE A DAY   omeprazole 40 MG capsule Commonly known as:  PRILOSEC Take by mouth daily.   VICTOZA 18 MG/3ML Sopn Generic drug:  liraglutide Inject into the skin.   Vitamin D 2000 units tablet Take by mouth.       Allergies:  Allergies  Allergen Reactions  . Triamcinolone Acetonide     Other reaction(s): Other (See Comments) nosebleeds    Family  History: No family history on file.  Social History:  reports that he has never smoked. He has never used smokeless tobacco. He reports that he drank alcohol. He reports that he does not use drugs.  ROS: UROLOGY Frequent Urination?: No Hard to postpone urination?: No Burning/pain with urination?: No Get up at night to urinate?: No Leakage of urine?: No Urine stream starts and stops?: No Trouble starting stream?: No Do you have to strain to urinate?: No Blood in urine?: No Urinary tract infection?: No Sexually transmitted disease?: No Injury to kidneys or bladder?: No Painful intercourse?: No Weak stream?: No Erection problems?: No Penile pain?: No  Gastrointestinal Nausea?: No Vomiting?: No Indigestion/heartburn?: No Diarrhea?: No Constipation?: No  Constitutional Fever: No Night sweats?: No Weight loss?: No Fatigue?: No  Skin Skin rash/lesions?: Yes Itching?: No  Eyes Blurred vision?: No Double vision?: No  Ears/Nose/Throat Sore throat?: No Sinus problems?: No  Hematologic/Lymphatic Swollen glands?: No Easy bruising?: No  Cardiovascular Leg swelling?: No Chest pain?: No  Respiratory Cough?: No Shortness of breath?: No  Endocrine Excessive thirst?: No  Musculoskeletal Back pain?: Yes Joint pain?: Yes  Neurological Headaches?: No Dizziness?: No  Psychologic Depression?: No Anxiety?: No  Physical Exam: BP (!) 198/92 (BP Location: Left Arm, Patient Position: Sitting, Cuff Size: Large)   Pulse 73   Ht 6' (1.829 m)  Wt (!) 370 lb (167.8 kg)   BMI 50.18 kg/m   Constitutional:  Alert and oriented, No acute distress. HEENT: [place] AT, moist mucus membranes.  Trachea midline, no masses. Cardiovascular: No clubbing, cyanosis, or edema. Respiratory: Normal respiratory effort, no increased work of breathing. GI: Abdomen is soft, nontender, nondistended, no abdominal masses GU: No CVA tenderness.  Phallus uncircumcised.  Foreskin easily  retracts.  The pigmentation of the inner prepuce.  Meatus is normal in appearance. Lymph: No cervical or inguinal lymphadenopathy. Skin: No rashes, bruises or suspicious lesions. Neurologic: Grossly intact, no focal deficits, moving all 4 extremities. Psychiatric: Normal mood and affect.   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  No areas suspicious for cancer or biopsy.  I recommended a dermatology evaluation regarding further therapy.  Circumcision can always be performed for persistent symptoms.  I offered him a dermatology referral however he wanted to make his appointment.   Return in about 1 year (around 07/12/2019) for Recheck.  Abbie Sons, [place] [address], [place], [place] [address] [phone]

## 2018-08-13 ENCOUNTER — Ambulatory Visit: Payer: BLUE CROSS/BLUE SHIELD | Attending: Family Medicine | Admitting: Physical Therapy

## 2018-08-13 DIAGNOSIS — R293 Abnormal posture: Secondary | ICD-10-CM | POA: Insufficient documentation

## 2018-08-13 DIAGNOSIS — M542 Cervicalgia: Secondary | ICD-10-CM | POA: Insufficient documentation

## 2018-08-13 DIAGNOSIS — M436 Torticollis: Secondary | ICD-10-CM | POA: Insufficient documentation

## 2018-08-13 DIAGNOSIS — M6281 Muscle weakness (generalized): Secondary | ICD-10-CM | POA: Insufficient documentation

## 2018-08-13 NOTE — Therapy (Signed)
Cody Green contacted PT after having a couple weeks of increase neck/ R shoulder pain.  Pt. Reports he is feeling better today but was having R posterior sh. Capsule pain with ER/ end-range flexion.  Pt. Has stopped going to the gym at [facility] in mid-September after completing PT in August.  PT recommends pt. Return to gym based ex. And try to return to bed (not recliner) to improve posture/ neck pain.  Pt. Instructed to contact PT if any worsening of pain symptoms.

## 2019-07-17 ENCOUNTER — Encounter: Payer: Self-pay | Admitting: Urology

## 2019-07-17 ENCOUNTER — Other Ambulatory Visit: Payer: Self-pay

## 2019-07-17 ENCOUNTER — Ambulatory Visit (INDEPENDENT_AMBULATORY_CARE_PROVIDER_SITE_OTHER): Payer: 59 | Admitting: Urology

## 2019-07-17 VITALS — BP 156/86 | HR 96 | Ht 72.99 in | Wt 354.0 lb

## 2019-07-17 DIAGNOSIS — R3129 Other microscopic hematuria: Secondary | ICD-10-CM

## 2019-07-17 DIAGNOSIS — R300 Dysuria: Secondary | ICD-10-CM

## 2019-07-17 NOTE — Progress Notes (Signed)
07/17/2019 3:42 PM   Cody Shell Jr. 07-31-1971 [id number]  Referring provider: Clarisse Gouge, MD [address] [place],  [place] [address]  Chief Complaint  Patient presents with  . Dysuria  . Follow-up    HPI: 48 y.o. male referred last year for lichen sclerosis treated with topical steroid.  I had recommended dermatology evaluation however he elected to continue prn clobetasol cream for symptom flares.  He denies voiding difficulty or trouble retracting his foreskin.  He does note a 2-day history of dysuria.  He states he has passed 2-3 kidney stones this year and states he feels like he may be passing a stone.   PMH: No past medical history on file.  Surgical History: No past surgical history on file.  Home Medications:  Allergies as of 07/17/2019      Reactions   Triamcinolone Acetonide    Other reaction(s): Other (See Comments) nosebleeds      Medication List       Accurate as of July 17, 2019  3:42 PM. If you have any questions, ask your nurse or doctor.        atorvastatin 20 MG tablet Commonly known as: LIPITOR Take 20 mg by mouth daily.   B-D ULTRAFINE III SHORT PEN 31G X 8 MM Misc Generic drug: Insulin Pen Needle See admin instructions.   benazepril 40 MG tablet Commonly known as: LOTENSIN TAKE 1 TABLET (40 MG TOTAL) BY MOUTH ONCE DAILY (SUBSTITUTE FOR LOTREL)   metFORMIN 500 MG tablet Commonly known as: GLUCOPHAGE Take by mouth.   methocarbamol 750 MG tablet Commonly known as: ROBAXIN Take 1,500 mg by mouth 3 (three) times daily.   nystatin powder Commonly known as: MYCOSTATIN/NYSTOP APPLY TO AFFECTED AREA TWICE A DAY   omeprazole 40 MG capsule Commonly known as: PRILOSEC Take by mouth daily.   Victoza 18 MG/3ML Sopn Generic drug: liraglutide Inject into the skin.   Vitamin D 50 MCG (2000 UT) tablet Take by mouth.       Allergies:  Allergies  Allergen Reactions  . Triamcinolone Acetonide      Other reaction(s): Other (See Comments) nosebleeds    Family History: No family history on file.  Social History:  reports that he has never smoked. He has never used smokeless tobacco. He reports previous alcohol use. He reports that he does not use drugs.  ROS: UROLOGY Frequent Urination?: No Hard to postpone urination?: No Burning/pain with urination?: Yes Get up at night to urinate?: Yes Leakage of urine?: No Urine stream starts and stops?: Yes Trouble starting stream?: Yes Do you have to strain to urinate?: No Blood in urine?: No Urinary tract infection?: No Sexually transmitted disease?: No Injury to kidneys or bladder?: No Painful intercourse?: No Weak stream?: No Erection problems?: No Penile pain?: No  Gastrointestinal Nausea?: No Vomiting?: No Indigestion/heartburn?: No Diarrhea?: No Constipation?: No  Constitutional Fever: No Night sweats?: No Weight loss?: No Fatigue?: No  Skin Skin rash/lesions?: No Itching?: No  Eyes Blurred vision?: No Double vision?: No  Ears/Nose/Throat Sore throat?: No Sinus problems?: No  Hematologic/Lymphatic Swollen glands?: No Easy bruising?: No  Cardiovascular Leg swelling?: No Chest pain?: No  Respiratory Cough?: No Shortness of breath?: No  Endocrine Excessive thirst?: No  Musculoskeletal Back pain?: No Joint pain?: No  Neurological Headaches?: No Dizziness?: No  Psychologic Depression?: No Anxiety?: No  Physical Exam: BP (!) 156/86 (BP Location: Left Arm, Patient Position: Sitting, Cuff Size: Large)   Pulse 96  Ht 6' 0.99" (1.854 m)   Wt (!) 354 lb (160.6 kg)   BMI 46.72 kg/m   Constitutional:  Alert and oriented, No acute distress. HEENT: [place] AT, moist mucus membranes.  Trachea midline, no masses. Cardiovascular: No clubbing, cyanosis, or edema. Respiratory: Normal respiratory effort, no increased work of breathing. GI: Abdomen is soft, nontender, nondistended, no abdominal masses  GU: Phallus foreskin easily retracts.  Hypopigmentation of the inner prepuce.  Testes descended bilaterally without masses or tenderness. Lymph: No cervical or inguinal lymphadenopathy. Skin: No rashes, bruises or suspicious lesions. Neurologic: Grossly intact, no focal deficits, moving all 4 extremities. Psychiatric: Normal mood and affect.  Laboratory Data:  Urinalysis Dipstick 2+ blood 1+ glucose Microscopy 3-10 RBC  Assessment & Plan:    - Microhematuria Mild dysuria and he states his symptoms are similar to prior stone passage.  Schedule noncontrast CT abdomen pelvis.   Abbie Sons, [place] [address], [place], [place] [address] [phone]

## 2019-07-18 LAB — URINALYSIS, COMPLETE
Bilirubin, UA: NEGATIVE
Ketones, UA: NEGATIVE
Leukocytes,UA: NEGATIVE
Nitrite, UA: NEGATIVE
Protein,UA: NEGATIVE
Specific Gravity, UA: 1.025 (ref 1.005–1.030)
Urobilinogen, Ur: 1 mg/dL (ref 0.2–1.0)
pH, UA: 7 (ref 5.0–7.5)

## 2019-07-18 LAB — MICROSCOPIC EXAMINATION: Bacteria, UA: NONE SEEN

## 2019-07-19 ENCOUNTER — Encounter: Payer: Self-pay | Admitting: Urology

## 2019-07-20 LAB — CULTURE, URINE COMPREHENSIVE

## 2019-07-30 ENCOUNTER — Other Ambulatory Visit: Payer: Self-pay

## 2019-07-30 ENCOUNTER — Ambulatory Visit
Admission: RE | Admit: 2019-07-30 | Discharge: 2019-07-30 | Disposition: A | Discharge status: Home / Self Care | Payer: 59 | Source: Ambulatory Visit | Attending: Urology | Admitting: Urology

## 2019-07-30 DIAGNOSIS — R3129 Other microscopic hematuria: Secondary | ICD-10-CM

## 2019-07-30 DIAGNOSIS — R300 Dysuria: Secondary | ICD-10-CM | POA: Diagnosis present

## 2019-08-04 ENCOUNTER — Telehealth: Payer: Self-pay

## 2019-08-04 NOTE — Telephone Encounter (Signed)
-----   Message from Abbie Sons, MD sent at 08/03/2019  7:06 PM EST ----- CT showed small, bilateral nonobstructing renal calculi and no evidence of ureteral calculi or obstruction.

## 2019-08-04 NOTE — Telephone Encounter (Signed)
Called pt informed him of the information below. Pt gave verbal understanding.  

## 2019-09-17 ENCOUNTER — Ambulatory Visit: Payer: 59 | Admitting: Urology

## 2019-09-25 ENCOUNTER — Ambulatory Visit (INDEPENDENT_AMBULATORY_CARE_PROVIDER_SITE_OTHER): Payer: 59 | Admitting: Urology

## 2019-09-25 ENCOUNTER — Encounter: Payer: Self-pay | Admitting: Urology

## 2019-09-25 ENCOUNTER — Other Ambulatory Visit: Payer: Self-pay

## 2019-09-25 VITALS — BP 152/88 | HR 78 | Ht 73.0 in | Wt 350.0 lb

## 2019-09-25 DIAGNOSIS — R972 Elevated prostate specific antigen [PSA]: Secondary | ICD-10-CM | POA: Diagnosis not present

## 2019-09-25 MED ORDER — SILODOSIN 8 MG PO CAPS: 8.0000 mg | ORAL_CAPSULE | Freq: Every day | ORAL | 0 refills | Status: DC

## 2019-09-25 NOTE — Progress Notes (Signed)
09/25/2019 1:56 PM   Cody Shell Jr. 10/31/1970 [id number]  Referring provider: Clarisse Gouge, MD [address] [place],  [place] [address]  Chief Complaint  Patient presents with  . Elevated PSA    HPI: 49 y.o. male previously seen for stone disease and BXO.  He was seen in early November 2020 for mild dysuria and urinalysis showed 3-10 RBCs.  Stone protocol CT was performed which showed bilateral, nonobstructing renal calculi.  His dysuria subsequently resolved.  He was seen by his PCP in December 2020 and a PSA was 10.9.  Baseline PSA the last several years has been in the 2-3 range.  His urinalysis 1 week after the PSA showed no RBCs or WBCs but did show 5-50 bacteria.  He does have lower urinary tract symptoms including urinary hesitancy and decreased stream.  Denies gross hematuria.   PMH: No past medical history on file.  Surgical History: No past surgical history on file.  Home Medications:  Allergies as of 09/25/2019      Reactions   Triamcinolone Acetonide    Other reaction(s): Other (See Comments) nosebleeds      Medication List       Accurate as of September 25, 2019  1:56 PM. If you have any questions, ask your nurse or doctor.        STOP taking these medications   chlorhexidine 0.12 % solution Commonly known as: PERIDEX Stopped by: Abbie Sons, MD   chlorthalidone 25 MG tablet Commonly known as: HYGROTON Stopped by: Abbie Sons, MD     TAKE these medications   amLODipine-benazepril 10-40 MG capsule Commonly known as: LOTREL Take 1 capsule by mouth daily.   atorvastatin 20 MG tablet Commonly known as: LIPITOR Take 20 mg by mouth daily.   B-D ULTRAFINE III SHORT PEN 31G X 8 MM Misc Generic drug: Insulin Pen Needle See admin instructions.   DULoxetine 60 MG capsule Commonly known as: CYMBALTA Take 60 mg by mouth daily.   ibuprofen 800 MG tablet Commonly known as: ADVIL Take 800 mg by mouth every 8  (eight) hours.   magnesium oxide 400 MG tablet Commonly known as: MAG-OX Take by mouth.   metFORMIN 500 MG tablet Commonly known as: GLUCOPHAGE Take by mouth.   omeprazole 40 MG capsule Commonly known as: PRILOSEC Take by mouth daily.   OneTouch Verio Flex System w/Device Kit See admin instructions.   potassium chloride 10 MEQ tablet Commonly known as: KLOR-CON TAKE 1 TO 2 BY MOUTH DAILY   Precision QID Test test strip Generic drug: glucose blood Use 1 each (1 strip total) once daily Use as instructed.   Victoza 18 MG/3ML Sopn Generic drug: liraglutide Inject into the skin.       Allergies:  Allergies  Allergen Reactions  . Triamcinolone Acetonide     Other reaction(s): Other (See Comments) nosebleeds    Family History: No family history on file.  Social History:  reports that he has never smoked. He has never used smokeless tobacco. He reports previous alcohol use. He reports that he does not use drugs.  ROS: UROLOGY Frequent Urination?: No Hard to postpone urination?: No Burning/pain with urination?: No Get up at night to urinate?: No Leakage of urine?: No Urine stream starts and stops?: Yes Trouble starting stream?: Yes Do you have to strain to urinate?: No Blood in urine?: No Urinary tract infection?: No Sexually transmitted disease?: No Injury to kidneys or bladder?: No Painful intercourse?:  No Weak stream?: No Erection problems?: No Penile pain?: No  Gastrointestinal Nausea?: No Vomiting?: No Indigestion/heartburn?: No Diarrhea?: No Constipation?: No  Constitutional Fever: No Night sweats?: No Weight loss?: No Fatigue?: No  Skin Skin rash/lesions?: No Itching?: No  Eyes Blurred vision?: No Double vision?: No  Ears/Nose/Throat Sore throat?: No Sinus problems?: No  Hematologic/Lymphatic Swollen glands?: No Easy bruising?: No  Cardiovascular Leg swelling?: No Chest pain?: No  Respiratory Cough?: No Shortness of  breath?: No  Endocrine Excessive thirst?: No  Musculoskeletal Back pain?: Yes Joint pain?: Yes  Neurological Headaches?: No Dizziness?: No  Psychologic Depression?: No Anxiety?: No  Physical Exam: BP (!) 152/88   Pulse 78   Ht _0  (1.854 m)   Wt (!) 350 lb (158.8 kg)   BMI 46.18 kg/m   Constitutional:  Alert and oriented, No acute distress. HEENT: [place] AT, moist mucus membranes.  Trachea midline, no masses. Cardiovascular: No clubbing, cyanosis, or edema. Respiratory: Normal respiratory effort, no increased work of breathing. GU: Prostate 40 g, smooth without nodules Neurologic: Grossly intact, no focal deficits, moving all 4 extremities. Psychiatric: Normal mood and affect.   Assessment & Plan:    - Elevated PSA Acute rise in his PSA most likely secondary to prostatic inflammation.  I recommended a 30-day alpha-blocker course with a repeat PSA in 1 month.  He has taken tamsulosin in the past and had some visual disturbances.  Rx low-dose and was sent to his pharmacy.  Repeat PSA 1 month.   Abbie Sons, [place] [address] [place], [place] [address] [phone]

## 2019-09-26 ENCOUNTER — Other Ambulatory Visit: Payer: Self-pay | Admitting: Urology

## 2019-09-26 ENCOUNTER — Telehealth: Payer: Self-pay | Admitting: Urology

## 2019-09-26 NOTE — Telephone Encounter (Signed)
Pt would like his RX sent to [name], he was told her could get it cheaper.

## 2019-09-29 ENCOUNTER — Other Ambulatory Visit: Payer: Self-pay | Admitting: *Deleted

## 2019-09-29 DIAGNOSIS — R972 Elevated prostate specific antigen [PSA]: Secondary | ICD-10-CM

## 2019-09-29 MED ORDER — SILODOSIN 8 MG PO CAPS: 8.0000 mg | ORAL_CAPSULE | Freq: Every day | ORAL | 0 refills | Status: DC

## 2019-09-29 NOTE — Telephone Encounter (Signed)
Patient called in and wanted his Silodosin sent to [facility] . Rx sent today

## 2019-10-01 NOTE — Telephone Encounter (Signed)
Confirmed with the patient that the RX for Rapaflo was sent to [name] on 09/29/19.

## 2019-10-24 ENCOUNTER — Other Ambulatory Visit: Payer: Self-pay | Admitting: Family Medicine

## 2019-10-24 DIAGNOSIS — R972 Elevated prostate specific antigen [PSA]: Secondary | ICD-10-CM

## 2019-10-27 ENCOUNTER — Other Ambulatory Visit: Payer: 59

## 2019-10-27 ENCOUNTER — Other Ambulatory Visit: Payer: Self-pay

## 2019-10-27 DIAGNOSIS — R972 Elevated prostate specific antigen [PSA]: Secondary | ICD-10-CM

## 2019-10-28 ENCOUNTER — Telehealth: Payer: Self-pay | Admitting: Urology

## 2019-10-28 DIAGNOSIS — R972 Elevated prostate specific antigen [PSA]: Secondary | ICD-10-CM

## 2019-10-28 LAB — PSA: Prostate Specific Ag, Serum: 11 ng/mL — ABNORMAL HIGH (ref 0.0–4.0)

## 2019-10-28 NOTE — Telephone Encounter (Signed)
Repeat PSA remains elevated at 11.0.  Recommend scheduling a prostate MRI.  Order was entered.

## 2019-10-29 NOTE — Telephone Encounter (Signed)
Notified patient as instructed, patient pleased. Discussed follow-up appointments, patient agrees  

## 2019-11-03 ENCOUNTER — Encounter: Payer: Self-pay | Admitting: Urology

## 2019-11-14 ENCOUNTER — Ambulatory Visit
Admission: RE | Admit: 2019-11-14 | Discharge: 2019-11-14 | Disposition: A | Discharge status: Home / Self Care | Payer: 59 | Source: Ambulatory Visit | Attending: Urology | Admitting: Urology

## 2019-11-14 ENCOUNTER — Other Ambulatory Visit: Payer: Self-pay

## 2019-11-14 DIAGNOSIS — R972 Elevated prostate specific antigen [PSA]: Secondary | ICD-10-CM | POA: Diagnosis present

## 2019-11-14 MED ORDER — GADOBUTROL 1 MMOL/ML IV SOLN
10.0000 mL | Freq: Once | INTRAVENOUS | Status: AC | PRN
  Administered 2019-11-14: 10 mL via INTRAVENOUS

## 2019-11-15 ENCOUNTER — Encounter: Payer: Self-pay | Admitting: Urology

## 2019-11-15 ENCOUNTER — Telehealth: Payer: Self-pay | Admitting: Urology

## 2019-11-15 DIAGNOSIS — R935 Abnormal findings on diagnostic imaging of other abdominal regions, including retroperitoneum: Secondary | ICD-10-CM

## 2019-11-15 DIAGNOSIS — R972 Elevated prostate specific antigen [PSA]: Secondary | ICD-10-CM

## 2019-11-15 NOTE — Telephone Encounter (Signed)
Elevated PSA with abnormal prostate MRI.  Referral for fusion biopsy entered.  Thanks

## 2019-11-25 ENCOUNTER — Encounter: Payer: Self-pay | Admitting: Urology

## 2019-12-09 ENCOUNTER — Ambulatory Visit
Admission: RE | Admit: 2019-12-09 | Discharge: 2019-12-09 | Disposition: A | Discharge status: Home / Self Care | Payer: 59 | Source: Ambulatory Visit | Attending: Physician Assistant | Admitting: Physician Assistant

## 2019-12-09 ENCOUNTER — Ambulatory Visit (INDEPENDENT_AMBULATORY_CARE_PROVIDER_SITE_OTHER): Payer: 59 | Admitting: Physician Assistant

## 2019-12-09 ENCOUNTER — Other Ambulatory Visit: Payer: Self-pay

## 2019-12-09 ENCOUNTER — Other Ambulatory Visit: Payer: Self-pay | Admitting: Family Medicine

## 2019-12-09 ENCOUNTER — Telehealth: Payer: Self-pay | Admitting: Family Medicine

## 2019-12-09 VITALS — BP 131/87 | HR 108 | Ht 73.0 in | Wt 350.0 lb

## 2019-12-09 DIAGNOSIS — R109 Unspecified abdominal pain: Secondary | ICD-10-CM | POA: Diagnosis not present

## 2019-12-09 DIAGNOSIS — N200 Calculus of kidney: Secondary | ICD-10-CM

## 2019-12-09 DIAGNOSIS — Z87442 Personal history of urinary calculi: Secondary | ICD-10-CM

## 2019-12-09 LAB — URINALYSIS, COMPLETE
Bilirubin, UA: NEGATIVE
Glucose, UA: NEGATIVE
Ketones, UA: NEGATIVE
Leukocytes,UA: NEGATIVE
Nitrite, UA: NEGATIVE
Protein,UA: NEGATIVE
Specific Gravity, UA: <1.005 — ABNORMAL LOW (ref 1.005–1.030)
Urobilinogen, Ur: 0.2 mg/dL (ref 0.2–1.0)
pH, UA: 5.5 (ref 5.0–7.5)

## 2019-12-09 LAB — MICROSCOPIC EXAMINATION
Bacteria, UA: NONE SEEN
RBC, Urine: >30 /hpf — AB (ref 0–2)

## 2019-12-09 MED ORDER — SILODOSIN 8 MG PO CAPS: 8.0000 mg | ORAL_CAPSULE | Freq: Every day | ORAL | 0 refills | Status: DC

## 2019-12-09 NOTE — Progress Notes (Signed)
12/09/2019 1:17 PM   Cody Shell Jr. 1971/03/31 [id number]  CC: Right flank pain  HPI: Cody Green. is a 49 y.o. male with a history of nephrolithiasis, BXO, and elevated PSA with abnormal prostate MRI awaiting fusion biopsy managed by Dr. Bernardo Heater who presents to the clinic for evaluation of possible acute stone episode.   CT stone study on 07/30/2019 revealed 3 right-sided renal stones, measuring 1, 2, and 4 mm as well as a punctate left renal stone, all nonobstructing.  KUB today with a 4 mm stone visualized within the right renal shadow.  Today, patient reports a 1 day history of intermittent right flank and low back pain as well as dysuria, lower abdominal pressure, and gross hematuria with small clots.  He denies fever, chills, nausea, and vomiting.  He has a history of kidney stones and has always passed them on his own, most recently approximately 6 months ago.    He has not been taking any medications for treatment of his pain.  He is not taking any alpha blockers at this time.  In-office UA today positive for 3+ blood; urine microscopy with >30 RBCs/HPF.  PMH: No past medical history on file.  Surgical History: No past surgical history on file.  Home Medications:  Allergies as of 12/09/2019      Reactions   Triamcinolone Acetonide    Other reaction(s): Other (See Comments) nosebleeds      Medication List       Accurate as of December 09, 2019  1:17 PM. If you have any questions, ask your nurse or doctor.        amLODipine-benazepril 10-40 MG capsule Commonly known as: LOTREL Take 1 capsule by mouth daily.   atorvastatin 20 MG tablet Commonly known as: LIPITOR Take 20 mg by mouth daily.   B-D ULTRAFINE III SHORT PEN 31G X 8 MM Misc Generic drug: Insulin Pen Needle See admin instructions.   DULoxetine 60 MG capsule Commonly known as: CYMBALTA Take 60 mg by mouth daily.   ibuprofen 800 MG tablet Commonly known as: ADVIL Take 800 mg by mouth every  8 (eight) hours.   magnesium oxide 400 MG tablet Commonly known as: MAG-OX Take by mouth.   metFORMIN 500 MG tablet Commonly known as: GLUCOPHAGE Take by mouth.   omeprazole 40 MG capsule Commonly known as: PRILOSEC Take by mouth daily.   OneTouch Verio Flex System w/Device Kit See admin instructions.   potassium chloride 10 MEQ tablet Commonly known as: KLOR-CON TAKE 1 TO 2 BY MOUTH DAILY   Precision QID Test test strip Generic drug: glucose blood Use 1 each (1 strip total) once daily Use as instructed.   silodosin 8 MG Caps capsule Commonly known as: RAPAFLO Take 1 capsule (8 mg total) by mouth daily with breakfast.   spironolactone 25 MG tablet Commonly known as: ALDACTONE Take 25 mg by mouth daily.   Victoza 18 MG/3ML Sopn Generic drug: liraglutide Inject into the skin.       Allergies:  Allergies  Allergen Reactions  . Triamcinolone Acetonide     Other reaction(s): Other (See Comments) nosebleeds    Family History: No family history on file.  Social History:  reports that he has never smoked. He has never used smokeless tobacco. He reports previous alcohol use. He reports that he does not use drugs.  Physical Exam: BP 131/87   Pulse (!) 108   Ht 6' 1"  (1.854 m)   Wt (!) 350 lb (  158.8 kg)   BMI 46.18 kg/m   Constitutional:  Alert and oriented, No acute distress. HEENT: [place] AT, moist mucus membranes.  Trachea midline, no masses. Cardiovascular: No clubbing, cyanosis, or edema. Respiratory: Normal respiratory effort, no increased work of breathing. Skin: No rashes, bruises or suspicious lesions. Neurologic: Grossly intact, no focal deficits, moving all 4 extremities. Psychiatric: Normal mood and affect.  Laboratory Data: Results for orders placed or performed in visit on 12/09/19  Microscopic Examination   URINE  Result Value Ref Range   WBC, UA 0-5 0 - 5 /hpf   RBC >30 (A) 0 - 2 /hpf   Epithelial Cells (non renal) 0-10 0 - 10 /hpf    Bacteria, UA None seen None seen/Few  Urinalysis, Complete  Result Value Ref Range   Specific Gravity, UA <1.005 (L) 1.005 - 1.030   pH, UA 5.5 5.0 - 7.5   Color, UA Yellow Yellow   Appearance Ur Clear Clear   Leukocytes,UA Negative Negative   Protein,UA Negative Negative/Trace   Glucose, UA Negative Negative   Ketones, UA Negative Negative   RBC, UA 3+ (A) Negative   Bilirubin, UA Negative Negative   Urobilinogen, Ur 0.2 0.2 - 1.0 mg/dL   Nitrite, UA Negative Negative   Microscopic Examination See below:    Pertinent Imaging: KUB, 12/09/2019: CLINICAL DATA:  Hematuria.  History of nephrolithiasis.  EXAM: ABDOMEN - 1 VIEW  COMPARISON:  Abdomen and pelvis CT dated 07/30/2019.  FINDINGS: Normal bowel gas pattern. Stable two small left inferior pelvic phleboliths. Possible small, faintly visualized calculus in the lower pole of the right kidney measuring 4 mm in diameter. Otherwise, no radiographically visible calcified urinary tract calculi. Lumbar and lower thoracic spine degenerative changes with fused, flowing osteophytes, compatible with changes of DISH  IMPRESSION: Possible 4 mm lower pole right renal calculus. No acute abnormality.   Electronically Signed   By: Claudie Revering M.D.   On: 12/09/2019 17:23  I personally reviewed the images referenced above note the persistence of a 4 mm right renal stone.  Assessment & Plan:   1. Flank pain with history of urolithiasis 49 year old male with history of nephrolithiasis and elevated PSA with abnormal prostate MRI awaiting fusion biopsy presents with a 1 day history of intermittent right flank and low back pain, lower abdominal pressure, dysuria, and gross hematuria with passage of small clots.  UA reassuring for infection today.  He is known to have 3 small right-sided renal stones, the largest of which appears stable within the right kidney.  I suspect he may be passing one or both of his smaller right-sided renal  stones at this time.  Counseled patients to push fluids, strain his urine, treat his pain with acetaminophen and/or ibuprofen, and start silodosin to initiate a trial of passage today.  Strainer provided today.  I would like him to follow-up in clinic with me in 4 weeks with a renal ultrasound prior.  If he remains symptomatic at that time and does not yet believe that he has passed a stone, we will proceed with cross-sectional imaging.  I reviewed warning signs today and counseled the patient to contact our office immediately if during office hours or proceed to the ED otherwise if he develops acute onset fever, chills, nausea, vomiting, intractable flank pain, the passage of large blood clots per urethra, or the inability to urinate.  He expressed understanding. - Urinalysis, Complete - US RENAL; Future - silodosin (RAPAFLO) 8 MG CAPS capsule; Take 1 capsule (  8 mg total) by mouth daily with breakfast.  Dispense: 30 capsule; Refill: 0   Return in about 4 weeks (around 01/06/2020) for Stone f/u with [facility] prior.   Debroah Loop, PA-C  [facility] [address], [place], [place] [address] [phone]

## 2019-12-09 NOTE — Telephone Encounter (Signed)
Patient called and states he was having right sided flank pain this morning and went to the bathroom and had a large amount of blood and small clots pass. He has a history of Kidney stones and recently had a image that confirms stone that were not active at the time. He states in the past he has passed a stone and tore the urethra. A KUB order was placed and an appointment has been made.

## 2019-12-09 NOTE — Patient Instructions (Addendum)
1. Start straining your urine. If you pass a stone and catch it, keep it so we can send it for analysis (you may either bring it with you to your next appointment or drop it off at our clinic during office hours). 2. Increase your fluid intake to help flush the stone out. 3. Start taking silodosin to help pass the stone. 4. Treat your pain with a maximum of acetaminophen 1000mg  AND ibuprofen 400mg  every 6-8 hours as needed. You may alternate these medications or take only one or the other if you do not need both at the same time. 5. I will see you back in clinic in 4 weeks with a kidney ultrasound prior to make sure you don't have any swelling in your kidney that would indicate a residual stone stuck in your urinary tract. 6. If you develop fever, chills, nausea, vomiting, untreatable flank pain, or the inability to urinate, either contact our office if we are open (8a-5p Monday-Friday) or go the Emergency Department immediately.

## 2019-12-24 NOTE — Telephone Encounter (Signed)
He had his fusion BX done on 12-22-19 and I have scheduled him a telephone appt with you on 12-31-19 at 8:00 for his results. You are so booked up that this was the only way to get him in for this. Let me know if you want to do something different.  Thanks, Sharyn Lull

## 2019-12-26 ENCOUNTER — Other Ambulatory Visit: Payer: Self-pay | Admitting: Urology

## 2019-12-29 ENCOUNTER — Other Ambulatory Visit: Payer: Self-pay | Admitting: Urology

## 2020-01-01 ENCOUNTER — Telehealth (INDEPENDENT_AMBULATORY_CARE_PROVIDER_SITE_OTHER): Payer: 59 | Admitting: Urology

## 2020-01-01 ENCOUNTER — Other Ambulatory Visit: Payer: Self-pay

## 2020-01-01 DIAGNOSIS — C61 Malignant neoplasm of prostate: Secondary | ICD-10-CM

## 2020-01-03 ENCOUNTER — Telehealth: Payer: Self-pay | Admitting: Urology

## 2020-01-03 ENCOUNTER — Encounter: Payer: Self-pay | Admitting: Urology

## 2020-01-03 DIAGNOSIS — C61 Malignant neoplasm of prostate: Secondary | ICD-10-CM

## 2020-01-03 NOTE — Telephone Encounter (Signed)
Please let patient know [facility] in [place] would be able to perform radical prostatectomy.  I put in the referral.

## 2020-01-03 NOTE — Progress Notes (Signed)
Virtual Visit via Video Note  I connected with Cody Green. on 01/03/20 at  8:00 AM EDT by a video enabled telemedicine application and verified that I am speaking with the correct person using two identifiers.  Location: Patient: Home Provider: [facility]   I discussed the limitations of evaluation and management by telemedicine and the availability of in person appointments. The patient expressed understanding and agreed to proceed.  History of Present Illness: 50 y.o. male status post fusion biopsy [place] on 12/22/2019.  PSA was 11.0 prostate MRI remarkable for a 58 cc volume and a PI-RADS 4 lesion right mid gland.  No pelvic adenopathy or evidence of extracapsular disease.  He had no post biopsy complaints.  Full cores of the ROI were obtained.  2/4 positive Gleason 3+4 adenocarcinoma and 1/4 Gleason 3+3.  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 The patient was counseled about the natural history of prostate cancer and the standard treatment options that are available for prostate cancer. It was explained to him how his age and life expectancy, clinical stage, Gleason score, and PSA affect his prognosis, the decision to proceed with additional staging studies, as well as how that information influences recommended treatment strategies. We discussed the roles for active surveillance, radiation therapy, radical prostatectomy, androgen deprivation, as well as ablative therapy options for the treatment of prostate cancer as appropriate to his individual cancer situation. We discussed the risks and benefits of these options with regard to their impact on cancer control and also in terms of potential adverse events, complications, and impact on quality of life  particularly related to urinary, bowel, and sexual function. The patient was encouraged to ask questions throughout the discussion today and all questions were answered to his stated satisfaction. In addition, the patient was provided with and/or directed to appropriate resources and literature for further education about prostate cancer treatment options.  He is most interested in radical prostatectomy which based on his age and pathology I do feel that is the best option.  Based on his BMI discussed referral to a tertiary center or [facility] and he was primarily interested in [facility].  Discussed with Dr. Tresa Moore and will set up a referral.   Cody Giovanni, MD  Follow Up Instructions:    I discussed the assessment and treatment plan with the patient. The patient was provided an opportunity to ask questions and all were answered. The patient agreed with the plan and demonstrated an understanding of the instructions.   The patient was advised to call back or seek an in-person evaluation if the symptoms worsen or if the condition fails to improve as anticipated.  I provided 15 minutes of non-face-to-face time during this encounter.   Abbie Sons, MD

## 2020-01-05 NOTE — Telephone Encounter (Signed)
Referral has been sent and patient is aware. I told him to let me know if he does not hear from them.   Sharyn Lull

## 2020-01-07 ENCOUNTER — Ambulatory Visit
Admission: RE | Admit: 2020-01-07 | Discharge: 2020-01-07 | Disposition: A | Discharge status: Home / Self Care | Payer: 59 | Source: Ambulatory Visit | Attending: Physician Assistant | Admitting: Physician Assistant

## 2020-01-07 ENCOUNTER — Other Ambulatory Visit: Payer: Self-pay

## 2020-01-07 DIAGNOSIS — R109 Unspecified abdominal pain: Secondary | ICD-10-CM

## 2020-01-07 DIAGNOSIS — Z87442 Personal history of urinary calculi: Secondary | ICD-10-CM | POA: Insufficient documentation

## 2020-01-12 ENCOUNTER — Ambulatory Visit: Payer: Self-pay | Admitting: Physician Assistant

## 2020-01-19 ENCOUNTER — Ambulatory Visit: Payer: 59 | Admitting: Physician Assistant

## 2020-01-22 ENCOUNTER — Other Ambulatory Visit: Payer: Self-pay

## 2020-01-22 ENCOUNTER — Encounter: Payer: Self-pay | Admitting: Physician Assistant

## 2020-01-22 ENCOUNTER — Ambulatory Visit (INDEPENDENT_AMBULATORY_CARE_PROVIDER_SITE_OTHER): Payer: 59 | Admitting: Physician Assistant

## 2020-01-22 VITALS — BP 147/83 | HR 93 | Ht 73.0 in

## 2020-01-22 DIAGNOSIS — Z87442 Personal history of urinary calculi: Secondary | ICD-10-CM | POA: Diagnosis not present

## 2020-01-22 DIAGNOSIS — R109 Unspecified abdominal pain: Secondary | ICD-10-CM | POA: Diagnosis not present

## 2020-01-22 NOTE — Progress Notes (Signed)
01/22/2020 9:17 AM   Cody Shell Jr. 08-23-1971 [id number]  CC: Right flank pain follow-up  HPI: Cody Green. is a 49 y.o. 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 who presents today for follow-up of right flank pain.  I saw him in clinic on 12/09/2019 with reports of a 1 day history of intermittent right flank and low back pain as well as dysuria, lower abdominal pressure, and gross hematuria with small clots.  He had previously been known to have 3 nonobstructing right-sided renal stones on CT; KUB that day revealed only 1 of these.  I suspected he may have been passing 1 or 2 of his smaller right renal stones and started him on a trial of passage with Rapaflo at that time.  Today, patient reports taking Azo for approximately 3 days following his appointment with me with significant improvement in dysuria. In retrospect, he believes he may have already passed the stone at the time of his last visit and was experiencing urinary discomfort due to urethral trauma from stone passage. He states he flank and back pain have completely resolved in the interim. He denies dysuria, frequency, urgency, and gross hematuria today.  Renal ultrasound dated 12/18/2019 revealed a solitary 8 mm nonobstructing right renal stone without bilateral hydronephrosis.  PMH: No past medical history on file.  Surgical History: No past surgical history on file.  Home Medications:  Allergies as of 01/22/2020      Reactions   Triamcinolone Acetonide    Other reaction(s): Other (See Comments) nosebleeds      Medication List       Accurate as of Jan 22, 2020  9:17 AM. If you have any questions, ask your nurse or doctor.        STOP taking these medications   ibuprofen 800 MG tablet Commonly known as: ADVIL Stopped by: Debroah Loop, PA-C   silodosin 8 MG Caps capsule Commonly known  as: RAPAFLO Stopped by: Debroah Loop, PA-C     TAKE these medications   amLODipine-benazepril 10-40 MG capsule Commonly known as: LOTREL Take 1 capsule by mouth daily.   atorvastatin 20 MG tablet Commonly known as: LIPITOR Take 20 mg by mouth daily.   B-D ULTRAFINE III SHORT PEN 31G X 8 MM Misc Generic drug: Insulin Pen Needle See admin instructions.   DULoxetine 60 MG capsule Commonly known as: CYMBALTA Take 60 mg by mouth daily.   magnesium oxide 400 MG tablet Commonly known as: MAG-OX Take by mouth.   metFORMIN 500 MG tablet Commonly known as: GLUCOPHAGE Take by mouth.   omeprazole 40 MG capsule Commonly known as: PRILOSEC Take by mouth daily.   OneTouch Verio Flex System w/Device Kit See admin instructions.   potassium chloride 10 MEQ tablet Commonly known as: KLOR-CON TAKE 1 TO 2 BY MOUTH DAILY   Precision QID Test test strip Generic drug: glucose blood Use 1 each (1 strip total) once daily Use as instructed.   spironolactone 25 MG tablet Commonly known as: ALDACTONE Take 25 mg by mouth daily.   Victoza 18 MG/3ML Sopn Generic drug: liraglutide Inject into the skin.       Allergies:  Allergies  Allergen Reactions  . Triamcinolone Acetonide     Other reaction(s): Other (See Comments) nosebleeds    Family History: No family history on file.  Social History:   reports that he has never smoked. He has never  used smokeless tobacco. He reports previous alcohol use. He reports that he does not use drugs.  Physical Exam: BP (!) 147/83   Pulse 93   Ht 6' 1"  (1.854 m)   BMI 46.18 kg/m   Constitutional:  Alert and oriented, no acute distress, nontoxic appearing HEENT: [place], AT Cardiovascular: No clubbing, cyanosis, or edema Respiratory: Normal respiratory effort, no increased work of breathing Skin: No rashes, bruises or suspicious lesions Neurologic: Grossly intact, no focal deficits, moving all 4 extremities Psychiatric: Normal mood  and affect  Pertinent Imaging: Results for orders placed during the hospital encounter of 01/07/20  US RENAL   Narrative CLINICAL DATA:  Flank pain.  EXAM: RENAL / URINARY TRACT ULTRASOUND COMPLETE  COMPARISON:  None.  FINDINGS: Right Kidney:  Renal measurements: 13.1 cm x 5.6 cm x 5.8 cm = volume: 223.3 mL . Echogenicity within normal limits. An 8 mm nonshadowing echogenic focus is seen within the lower pole of the right kidney. No hydronephrosis visualized.  Left Kidney:  Renal measurements: 13.7 cm x 6.1 cm x 6.3 cm = volume: 274.2 mL. Echogenicity within normal limits. No mass or hydronephrosis visualized.  Bladder:  Appears normal for degree of bladder distention.  Other:  Of incidental note is the presence of a paddle splenomegaly.  IMPRESSION: 1. 8 mm nonobstructing renal stone within the right kidney. 2. Hepatosplenomegaly.   Electronically Signed   By: Virgina Norfolk M.D.   On: 01/07/2020 23:13    I personally reviewed the images referenced above and a nonobstructing right renal stone without bilateral hydronephrosis.  Assessment & Plan:   1. Flank pain with history of urolithiasis Symptoms resolved with possible small stone passage prior to his last appointment with me.  Renal ultrasound reassuring for residual obstructing stones.  Counseled patient to continue with plans for radical prostatectomy and contact our office if his pain returns.  He expressed understanding.  Return if symptoms worsen or fail to improve.  Debroah Loop, PA-C  [facility] [address] [place], [place] [address] [phone]

## 2020-01-30 ENCOUNTER — Other Ambulatory Visit: Payer: Self-pay | Admitting: Urology

## 2020-02-11 NOTE — Patient Instructions (Addendum)
DUE TO COVID-19 ONLY ONE VISITOR IS ALLOWED TO COME WITH YOU AND STAY IN THE WAITING ROOM ONLY DURING PRE OP AND PROCEDURE DAY OF SURGERY. THE 1 VISITOR MAY VISIT WITH YOU AFTER SURGERY IN YOUR PRIVATE ROOM DURING VISITING HOURS ONLY!  YOU NEED TO HAVE A COVID 19 TEST ON 02-21-20 @12 :05 PM, THIS TEST MUST BE DONE BEFORE SURGERY, COME  [address], [place] , [id number].  ([place]) ONCE YOUR COVID TEST IS COMPLETED, PLEASE BEGIN THE QUARANTINE INSTRUCTIONS AS OUTLINED IN YOUR HANDOUT.                Cody Shell Jr.  02/11/2020   Your procedure is scheduled on: 02-25-20   Report to [facility] Main  Entrance    Report to admitting at 6:30 AM     Call this number if you have problems the morning of surgery [phone]    Remember: Per your surgeon, please follow a Clear Liquid Diet the Day Before Surgery, along with your prep instructions.    CLEAR LIQUID DIET   Foods Allowed                                                                     Foods Excluded  Coffee and tea, regular and decaf                             liquids that you cannot  Plain Jell-O any favor except red or purple                                           see through such as: Fruit ices (not with fruit pulp)                                     milk, soups, orange juice  Iced Popsicles                                    All solid food Carbonated beverages, regular and diet                                    Cranberry, grape and apple juices Sports drinks like Gatorade Lightly seasoned clear broth or consume(fat free) Sugar, honey syrup  Sample Menu Breakfast                                Lunch                                     Supper Cranberry juice                    Beef broth  Chicken broth Jell-O                                     Grape juice                           Apple juice Coffee or tea                        Jell-O                                       Popsicle                                                Coffee or tea                        Coffee or tea  _____________________________________________________________________    Do not eat food or drink liquids :After Midnight.     Take these medicines the morning of surgery with A SIP OF WATER: Atorvastatin (Lipitor), Duloxetine (Cymbalta), and Omeprazole (Prilosec)  BRUSH YOUR TEETH MORNING OF SURGERY AND RINSE YOUR MOUTH OUT, NO CHEWING GUM CANDY OR MINTS.                                You may not have any metal on your body including hair pins and              piercings     Do not wear jewelry, cologne, lotions, powders or  deodorant              Men may shave face and neck.   Do not bring valuables to the hospital. [place].  Contacts, dentures or bridgework may not be worn into surgery.  You may bring a small overnight bag    Special Instructions: N/A              Please read over the following fact sheets you were given: _____________________________________________________________________  How to Manage Your Diabetes Before and After Surgery  Why is it important to control my blood sugar before and after surgery? . Improving blood sugar levels before and after surgery helps healing and can limit problems. . A way of improving blood sugar control is eating a healthy diet by: o  Eating less sugar and carbohydrates o  Increasing activity/exercise o  Talking with your doctor about reaching your blood sugar goals . High blood sugars (greater than 180 mg/dL) can raise your risk of infections and slow your recovery, so you will need to focus on controlling your diabetes during the weeks before surgery. . Make sure that the doctor who takes care of your diabetes knows about your planned surgery including the date and location.  How do I manage my blood sugar before surgery? . Check your blood sugar at  least 4 times a day, starting 2 days before surgery, to make sure that the level  is not too high or low. o Check your blood sugar the morning of your surgery when you wake up and every 2 hours until you get to the Short Stay unit. . If your blood sugar is less than 70 mg/dL, you will need to treat for low blood sugar: o Do not take insulin. o Treat a low blood sugar (less than 70 mg/dL) with  cup of clear juice (cranberry or apple), 4 glucose tablets, OR glucose gel. o Recheck blood sugar in 15 minutes after treatment (to make sure it is greater than 70 mg/dL). If your blood sugar is not greater than 70 mg/dL on recheck, call [phone] for further instructions. . Report your blood sugar to the short stay nurse when you get to Short Stay.  . If you are admitted to the hospital after surgery: o Your blood sugar will be checked by the staff and you will probably be given insulin after surgery (instead of oral diabetes medicines) to make sure you have good blood sugar levels. o The goal for blood sugar control after surgery is 80-180 mg/dL.   WHAT DO I DO ABOUT MY DIABETES MEDICATION?  Marland Kitchen Do not take oral diabetes medicines (pills) the morning of surgery.  Marland Kitchen DAY BEFORE SURGERY, take Metformin as prescribed.        Reviewed and Endorsed by [facility] Patient Education Committee, August 2015           [facility] - Preparing for Surgery Before surgery, you can play an important role.  Because skin is not sterile, your skin needs to be as free of germs as possible.  You can reduce the number of germs on your skin by washing with CHG (chlorahexidine gluconate) soap before surgery.  CHG is an antiseptic cleaner which kills germs and bonds with the skin to continue killing germs even after washing. Please DO NOT use if you have an allergy to CHG or antibacterial soaps.  If your skin becomes reddened/irritated stop using the CHG and inform your nurse when you arrive at Short Stay. Do not shave  (including legs and underarms) for at least 48 hours prior to the first CHG shower.  You may shave your face/neck. Please follow these instructions carefully:  1.  Shower with CHG Soap the night before surgery and the  morning of Surgery.  2.  If you choose to wash your hair, wash your hair first as usual with your  normal  shampoo.  3.  After you shampoo, rinse your hair and body thoroughly to remove the  shampoo.                           4.  Use CHG as you would any other liquid soap.  You can apply chg directly  to the skin and wash                       Gently with a scrungie or clean washcloth.  5.  Apply the CHG Soap to your body ONLY FROM THE NECK DOWN.   Do not use on face/ open                           Wound or open sores. Avoid contact with eyes, ears mouth and genitals (private parts).  Wash face,  Genitals (private parts) with your normal soap.             6.  Wash thoroughly, paying special attention to the area where your surgery  will be performed.  7.  Thoroughly rinse your body with warm water from the neck down.  8.  DO NOT shower/wash with your normal soap after using and rinsing off  the CHG Soap.                9.  Pat yourself dry with a clean towel.            10.  Wear clean pajamas.            11.  Place clean sheets on your bed the night of your first shower and do not  sleep with pets. Day of Surgery : Do not apply any lotions/deodorants the morning of surgery.  Please wear clean clothes to the hospital/surgery center.  FAILURE TO FOLLOW THESE INSTRUCTIONS MAY RESULT IN THE CANCELLATION OF YOUR SURGERY PATIENT SIGNATURE_________________________________  NURSE SIGNATURE__________________________________  ________________________________________________________________________

## 2020-02-11 NOTE — Progress Notes (Addendum)
PCP - Modena Morrow, MD LOV 01-19-20 Cardiologist - N/A  PPM/ICD -  Device Orders -  Rep Notified -   Chest x-ray -  EKG -  Stress Test -  ECHO -  Cardiac Cath -   Sleep Study -  CPAP -     Fasting Blood Sugar -  Checks Blood Sugar _____ times a day  Blood Thinner Instructions: Aspirin Instructions:  ERAS Protcol - PRE-SURGERY Ensure or G2-   COVID TEST-   COVID VACCINATIONS x 2 Anesthesia review:   Patient denies shortness of breath, fever, cough and chest pain at PAT appointment   All instructions explained to the patient, with a verbal understanding of the material. Patient agrees to go over the instructions while at home for a better understanding. Patient also instructed to self quarantine after being tested for COVID-19. The opportunity to ask questions was provided.

## 2020-02-13 ENCOUNTER — Other Ambulatory Visit: Payer: Self-pay

## 2020-02-13 ENCOUNTER — Encounter (HOSPITAL_COMMUNITY): Payer: Self-pay

## 2020-02-13 ENCOUNTER — Encounter (HOSPITAL_COMMUNITY)
Admission: RE | Admit: 2020-02-13 | Discharge: 2020-02-13 | Disposition: A | Discharge status: Home / Self Care | Payer: 59 | Source: Ambulatory Visit | Attending: Urology | Admitting: Urology

## 2020-02-13 HISTORY — DX: Depression, unspecified: F32.A

## 2020-02-13 HISTORY — DX: Sleep apnea, unspecified: G47.30

## 2020-02-13 HISTORY — DX: Anxiety disorder, unspecified: F41.9

## 2020-02-13 HISTORY — DX: Gastro-esophageal reflux disease without esophagitis: K21.9

## 2020-02-13 HISTORY — DX: Essential (primary) hypertension: I10

## 2020-02-13 HISTORY — DX: Chronic kidney disease, unspecified: N18.9

## 2020-02-17 ENCOUNTER — Encounter (HOSPITAL_COMMUNITY)
Admission: RE | Admit: 2020-02-17 | Discharge: 2020-02-17 | Disposition: A | Discharge status: Home / Self Care | Payer: 59 | Source: Ambulatory Visit | Attending: Urology | Admitting: Urology

## 2020-02-17 ENCOUNTER — Other Ambulatory Visit: Payer: Self-pay

## 2020-02-17 DIAGNOSIS — Z01818 Encounter for other preprocedural examination: Secondary | ICD-10-CM | POA: Insufficient documentation

## 2020-02-17 LAB — CBC
HCT: 41 % (ref 39.0–52.0)
Hemoglobin: 13.9 g/dL (ref 13.0–17.0)
MCH: 28.3 pg (ref 26.0–34.0)
MCHC: 33.9 g/dL (ref 30.0–36.0)
MCV: 83.5 fL (ref 80.0–100.0)
Platelets: 293 10*3/uL (ref 150–400)
RBC: 4.91 MIL/uL (ref 4.22–5.81)
RDW: 13.3 % (ref 11.5–15.5)
WBC: 8.9 10*3/uL (ref 4.0–10.5)
nRBC: 0 % (ref 0.0–0.2)

## 2020-02-17 LAB — BASIC METABOLIC PANEL
Anion gap: 12 (ref 5–15)
BUN: 13 mg/dL (ref 6–20)
CO2: 24 mmol/L (ref 22–32)
Calcium: 9.5 mg/dL (ref 8.9–10.3)
Chloride: 101 mmol/L (ref 98–111)
Creatinine, Ser: 0.91 mg/dL (ref 0.61–1.24)
GFR calc Af Amer: >60 mL/min (ref >60)
GFR calc non Af Amer: >60 mL/min (ref >60)
Glucose, Bld: 116 mg/dL — ABNORMAL HIGH (ref 70–99)
Potassium: 4.6 mmol/L (ref 3.5–5.1)
Sodium: 137 mmol/L (ref 135–145)

## 2020-02-17 LAB — HEMOGLOBIN A1C
Hgb A1c MFr Bld: 6.1 % — ABNORMAL HIGH (ref 4.8–5.6)
Mean Plasma Glucose: 128.37 mg/dL

## 2020-02-21 ENCOUNTER — Other Ambulatory Visit (HOSPITAL_COMMUNITY)
Admission: RE | Admit: 2020-02-21 | Discharge: 2020-02-21 | Disposition: A | Discharge status: Home / Self Care | Payer: 59 | Source: Ambulatory Visit | Attending: Urology | Admitting: Urology

## 2020-02-21 DIAGNOSIS — Z20822 Contact with and (suspected) exposure to covid-19: Secondary | ICD-10-CM | POA: Insufficient documentation

## 2020-02-21 DIAGNOSIS — Z01812 Encounter for preprocedural laboratory examination: Secondary | ICD-10-CM | POA: Insufficient documentation

## 2020-02-21 LAB — SARS CORONAVIRUS 2 (TAT 6-24 HRS): SARS Coronavirus 2: NEGATIVE

## 2020-02-24 MED ORDER — DEXTROSE 5 % IV SOLN
3.0000 g | INTRAVENOUS | Status: AC
  Administered 2020-02-25: 3 g via INTRAVENOUS
  Filled 2020-02-24: qty 3

## 2020-02-24 NOTE — Anesthesia Preprocedure Evaluation (Addendum)
Anesthesia Evaluation  Patient identified by MRN, date of birth, ID band Patient awake    Reviewed: Allergy & Precautions, NPO status , Patient's Chart, lab work & pertinent test results  History of Anesthesia Complications Negative for: history of anesthetic complications  Airway Mallampati: III  TM Distance: >3 FB Neck ROM: Full    Dental  (+) Teeth Intact   Pulmonary sleep apnea and Continuous Positive Airway Pressure Ventilation ,    Pulmonary exam normal        Cardiovascular hypertension, Pt. on medications Normal cardiovascular exam     Neuro/Psych Anxiety Depression negative neurological ROS     GI/Hepatic Neg liver ROS, GERD  ,  Endo/Other  diabetes, Type 2, Oral Hypoglycemic AgentsMorbid obesity  Renal/GU Renal InsufficiencyRenal disease   Prostate cancer    Musculoskeletal negative musculoskeletal ROS (+)   Abdominal   Peds  Hematology negative hematology ROS (+)   Anesthesia Other Findings   Reproductive/Obstetrics                            Anesthesia Physical Anesthesia Plan  ASA: III  Anesthesia Plan: General   Post-op Pain Management:    Induction: Intravenous  PONV Risk Score and Plan: 3 and Ondansetron, Dexamethasone, Treatment may vary due to age or medical condition and Midazolam  Airway Management Planned: Oral ETT  Additional Equipment: None  Intra-op Plan:   Post-operative Plan: Extubation in OR  Informed Consent: I have reviewed the patients History and Physical, chart, labs and discussed the procedure including the risks, benefits and alternatives for the proposed anesthesia with the patient or authorized representative who has indicated his/her understanding and acceptance.     Dental advisory given  Plan Discussed with:   Anesthesia Plan Comments:        Anesthesia Quick Evaluation

## 2020-02-25 ENCOUNTER — Ambulatory Visit (HOSPITAL_COMMUNITY): Payer: 59 | Admitting: Certified Registered Nurse Anesthetist

## 2020-02-25 ENCOUNTER — Other Ambulatory Visit: Payer: Self-pay

## 2020-02-25 ENCOUNTER — Encounter (HOSPITAL_COMMUNITY)
Admission: RE | Disposition: A | Discharge status: Home / Self Care | Payer: Self-pay | Source: Home / Self Care | Attending: Urology

## 2020-02-25 ENCOUNTER — Observation Stay (HOSPITAL_COMMUNITY)
Admission: RE | Admit: 2020-02-25 | Discharge: 2020-02-26 | Disposition: A | Discharge status: Home / Self Care | Payer: 59 | Attending: Urology | Admitting: Urology

## 2020-02-25 ENCOUNTER — Encounter (HOSPITAL_COMMUNITY): Payer: Self-pay | Admitting: Urology

## 2020-02-25 DIAGNOSIS — Z7984 Long term (current) use of oral hypoglycemic drugs: Secondary | ICD-10-CM | POA: Diagnosis not present

## 2020-02-25 DIAGNOSIS — Z888 Allergy status to other drugs, medicaments and biological substances status: Secondary | ICD-10-CM | POA: Diagnosis not present

## 2020-02-25 DIAGNOSIS — N189 Chronic kidney disease, unspecified: Secondary | ICD-10-CM | POA: Diagnosis not present

## 2020-02-25 DIAGNOSIS — E119 Type 2 diabetes mellitus without complications: Secondary | ICD-10-CM | POA: Insufficient documentation

## 2020-02-25 DIAGNOSIS — G4730 Sleep apnea, unspecified: Secondary | ICD-10-CM | POA: Diagnosis not present

## 2020-02-25 DIAGNOSIS — Z6841 Body Mass Index (BMI) 40.0 and over, adult: Secondary | ICD-10-CM | POA: Diagnosis not present

## 2020-02-25 DIAGNOSIS — C61 Malignant neoplasm of prostate: Secondary | ICD-10-CM | POA: Diagnosis not present

## 2020-02-25 DIAGNOSIS — I129 Hypertensive chronic kidney disease with stage 1 through stage 4 chronic kidney disease, or unspecified chronic kidney disease: Secondary | ICD-10-CM | POA: Diagnosis not present

## 2020-02-25 DIAGNOSIS — K219 Gastro-esophageal reflux disease without esophagitis: Secondary | ICD-10-CM | POA: Insufficient documentation

## 2020-02-25 HISTORY — PX: LYMPHADENECTOMY: SHX5960

## 2020-02-25 HISTORY — PX: ROBOT ASSISTED LAPAROSCOPIC RADICAL PROSTATECTOMY: SHX5141

## 2020-02-25 LAB — GLUCOSE, CAPILLARY
Glucose-Capillary: 119 mg/dL — ABNORMAL HIGH (ref 70–99)
Glucose-Capillary: 234 mg/dL — ABNORMAL HIGH (ref 70–99)
Glucose-Capillary: 207 mg/dL — ABNORMAL HIGH (ref 70–99)
Glucose-Capillary: 221 mg/dL — ABNORMAL HIGH (ref 70–99)
Glucose-Capillary: 201 mg/dL — ABNORMAL HIGH (ref 70–99)

## 2020-02-25 LAB — CBC
HCT: 37.8 % — ABNORMAL LOW (ref 39.0–52.0)
Hemoglobin: 13 g/dL (ref 13.0–17.0)
MCH: 28.1 pg (ref 26.0–34.0)
MCHC: 34.4 g/dL (ref 30.0–36.0)
MCV: 81.8 fL (ref 80.0–100.0)
Platelets: 333 10*3/uL (ref 150–400)
RBC: 4.62 MIL/uL (ref 4.22–5.81)
RDW: 13.2 % (ref 11.5–15.5)
WBC: 25.6 10*3/uL — ABNORMAL HIGH (ref 4.0–10.5)
nRBC: 0 % (ref 0.0–0.2)

## 2020-02-25 LAB — HEMOGLOBIN AND HEMATOCRIT, BLOOD
HCT: 39.3 % (ref 39.0–52.0)
Hemoglobin: 13.7 g/dL (ref 13.0–17.0)

## 2020-02-25 LAB — CREATININE, SERUM
Creatinine, Ser: 0.92 mg/dL (ref 0.61–1.24)
GFR calc Af Amer: >60 mL/min (ref >60)
GFR calc non Af Amer: >60 mL/min (ref >60)

## 2020-02-25 SURGERY — PROSTATECTOMY, RADICAL, ROBOT-ASSISTED, LAPAROSCOPIC
Anesthesia: General

## 2020-02-25 MED ORDER — AMLODIPINE BESY-BENAZEPRIL HCL 10-40 MG PO CAPS: 1.0000 | ORAL_CAPSULE | Freq: Every day | ORAL | Status: DC

## 2020-02-25 MED ORDER — HEPARIN SODIUM (PORCINE) 5000 UNIT/ML IJ SOLN
5000.0000 [IU] | Freq: Three times a day (TID) | INTRAMUSCULAR | Status: DC
  Administered 2020-02-25 – 2020-02-26 (×3): 5000 [IU] via SUBCUTANEOUS
  Filled 2020-02-25 (×3): qty 1

## 2020-02-25 MED ORDER — DOCUSATE SODIUM 100 MG PO CAPS
100.0000 mg | ORAL_CAPSULE | Freq: Two times a day (BID) | ORAL | Status: DC
  Administered 2020-02-25 – 2020-02-26 (×2): 100 mg via ORAL
  Filled 2020-02-25 (×2): qty 1

## 2020-02-25 MED ORDER — BELLADONNA ALKALOIDS-OPIUM 16.2-60 MG RE SUPP
RECTAL | Status: AC
  Filled 2020-02-25: qty 1

## 2020-02-25 MED ORDER — STERILE WATER FOR IRRIGATION IR SOLN
Status: DC | PRN
  Administered 2020-02-25: 1000 mL

## 2020-02-25 MED ORDER — INSULIN ASPART 100 UNIT/ML ~~LOC~~ SOLN
0.0000 [IU] | Freq: Three times a day (TID) | SUBCUTANEOUS | Status: DC
  Administered 2020-02-25: 4 [IU] via SUBCUTANEOUS
  Administered 2020-02-26 (×2): 3 [IU] via SUBCUTANEOUS

## 2020-02-25 MED ORDER — HYDROCODONE-ACETAMINOPHEN 5-325 MG PO TABS: 1.0000 | ORAL_TABLET | Freq: Four times a day (QID) | ORAL | 0 refills | Status: AC | PRN

## 2020-02-25 MED ORDER — SUCCINYLCHOLINE CHLORIDE 200 MG/10ML IV SOSY
PREFILLED_SYRINGE | INTRAVENOUS | Status: DC | PRN
  Administered 2020-02-25: 160 mg via INTRAVENOUS

## 2020-02-25 MED ORDER — OXYCODONE HCL 5 MG PO TABS
5.0000 mg | ORAL_TABLET | ORAL | Status: DC | PRN
  Administered 2020-02-25 – 2020-02-26 (×4): 5 mg via ORAL
  Filled 2020-02-25 (×4): qty 1

## 2020-02-25 MED ORDER — SUCCINYLCHOLINE CHLORIDE 200 MG/10ML IV SOSY
PREFILLED_SYRINGE | INTRAVENOUS | Status: AC
  Filled 2020-02-25: qty 10

## 2020-02-25 MED ORDER — FENTANYL CITRATE (PF) 100 MCG/2ML IJ SOLN
25.0000 ug | INTRAMUSCULAR | Status: DC | PRN
  Administered 2020-02-25: 50 ug via INTRAVENOUS
  Administered 2020-02-25 (×2): 25 ug via INTRAVENOUS

## 2020-02-25 MED ORDER — POTASSIUM CHLORIDE CRYS ER 10 MEQ PO TBCR
10.0000 meq | EXTENDED_RELEASE_TABLET | Freq: Every day | ORAL | Status: DC
  Administered 2020-02-25 – 2020-02-26 (×2): 10 meq via ORAL
  Filled 2020-02-25 (×2): qty 1

## 2020-02-25 MED ORDER — ONDANSETRON HCL 4 MG/2ML IJ SOLN: 4.0000 mg | INTRAMUSCULAR | Status: DC | PRN

## 2020-02-25 MED ORDER — INSULIN ASPART 100 UNIT/ML ~~LOC~~ SOLN
5.0000 [IU] | Freq: Once | SUBCUTANEOUS | Status: AC
  Administered 2020-02-25: 5 [IU] via SUBCUTANEOUS

## 2020-02-25 MED ORDER — MIDAZOLAM HCL 2 MG/2ML IJ SOLN
INTRAMUSCULAR | Status: AC
  Filled 2020-02-25: qty 2

## 2020-02-25 MED ORDER — DIPHENHYDRAMINE HCL 50 MG/ML IJ SOLN: 12.5000 mg | Freq: Four times a day (QID) | INTRAMUSCULAR | Status: DC | PRN

## 2020-02-25 MED ORDER — FENTANYL CITRATE (PF) 100 MCG/2ML IJ SOLN
INTRAMUSCULAR | Status: DC | PRN
  Administered 2020-02-25 (×5): 50 ug via INTRAVENOUS

## 2020-02-25 MED ORDER — LACTATED RINGERS IR SOLN
Status: DC | PRN
  Administered 2020-02-25: 1000 mL

## 2020-02-25 MED ORDER — OXYCODONE HCL 5 MG/5ML PO SOLN: 5.0000 mg | Freq: Once | ORAL | Status: DC | PRN

## 2020-02-25 MED ORDER — PANTOPRAZOLE SODIUM 40 MG PO TBEC
80.0000 mg | DELAYED_RELEASE_TABLET | Freq: Every day | ORAL | Status: DC
  Administered 2020-02-26: 80 mg via ORAL
  Filled 2020-02-25: qty 2

## 2020-02-25 MED ORDER — OXYCODONE HCL 5 MG PO TABS: 5.0000 mg | ORAL_TABLET | Freq: Once | ORAL | Status: DC | PRN

## 2020-02-25 MED ORDER — DIPHENHYDRAMINE HCL 12.5 MG/5ML PO ELIX: 12.5000 mg | ORAL_SOLUTION | Freq: Four times a day (QID) | ORAL | Status: DC | PRN

## 2020-02-25 MED ORDER — ONDANSETRON HCL 4 MG/2ML IJ SOLN
INTRAMUSCULAR | Status: AC
  Filled 2020-02-25: qty 2

## 2020-02-25 MED ORDER — BACITRACIN-NEOMYCIN-POLYMYXIN 400-5-5000 EX OINT: 1.0000 "application " | TOPICAL_OINTMENT | Freq: Three times a day (TID) | CUTANEOUS | Status: DC | PRN

## 2020-02-25 MED ORDER — BELLADONNA ALKALOIDS-OPIUM 16.2-60 MG RE SUPP
1.0000 | Freq: Four times a day (QID) | RECTAL | Status: DC | PRN
  Administered 2020-02-25: 1 via RECTAL

## 2020-02-25 MED ORDER — DEXAMETHASONE SODIUM PHOSPHATE 4 MG/ML IJ SOLN
INTRAMUSCULAR | Status: DC | PRN
  Administered 2020-02-25: 4 mg via INTRAVENOUS

## 2020-02-25 MED ORDER — HYDROMORPHONE HCL 1 MG/ML IJ SOLN
0.5000 mg | INTRAMUSCULAR | Status: DC | PRN
  Administered 2020-02-25 – 2020-02-26 (×2): 1 mg via INTRAVENOUS
  Filled 2020-02-25 (×2): qty 1

## 2020-02-25 MED ORDER — SULFAMETHOXAZOLE-TRIMETHOPRIM 800-160 MG PO TABS
1.0000 | ORAL_TABLET | Freq: Two times a day (BID) | ORAL | 0 refills | Status: AC
Start: 2020-02-25 — End: ?

## 2020-02-25 MED ORDER — ONDANSETRON HCL 4 MG/2ML IJ SOLN: 4.0000 mg | Freq: Once | INTRAMUSCULAR | Status: DC | PRN

## 2020-02-25 MED ORDER — SODIUM CHLORIDE 0.9 % IV BOLUS
1000.0000 mL | Freq: Once | INTRAVENOUS | Status: AC
  Administered 2020-02-25: 1000 mL via INTRAVENOUS

## 2020-02-25 MED ORDER — FENTANYL CITRATE (PF) 250 MCG/5ML IJ SOLN
INTRAMUSCULAR | Status: AC
  Filled 2020-02-25: qty 5

## 2020-02-25 MED ORDER — ROCURONIUM BROMIDE 10 MG/ML (PF) SYRINGE
PREFILLED_SYRINGE | INTRAVENOUS | Status: AC
  Filled 2020-02-25: qty 10

## 2020-02-25 MED ORDER — PROPOFOL 10 MG/ML IV BOLUS
INTRAVENOUS | Status: AC
  Filled 2020-02-25: qty 20

## 2020-02-25 MED ORDER — ROCURONIUM BROMIDE 10 MG/ML (PF) SYRINGE
PREFILLED_SYRINGE | INTRAVENOUS | Status: AC
  Filled 2020-02-25: qty 20

## 2020-02-25 MED ORDER — ATORVASTATIN CALCIUM 20 MG PO TABS
20.0000 mg | ORAL_TABLET | Freq: Every day | ORAL | Status: DC
  Administered 2020-02-25: 20 mg via ORAL
  Filled 2020-02-25: qty 1

## 2020-02-25 MED ORDER — ONDANSETRON HCL 4 MG/2ML IJ SOLN
INTRAMUSCULAR | Status: DC | PRN
  Administered 2020-02-25: 4 mg via INTRAVENOUS

## 2020-02-25 MED ORDER — BUPIVACAINE LIPOSOME 1.3 % IJ SUSP
20.0000 mL | Freq: Once | INTRAMUSCULAR | Status: AC
  Administered 2020-02-25: 20 mL
  Filled 2020-02-25: qty 20

## 2020-02-25 MED ORDER — CHLORHEXIDINE GLUCONATE 0.12 % MT SOLN
15.0000 mL | Freq: Once | OROMUCOSAL | Status: AC
  Administered 2020-02-25: 15 mL via OROMUCOSAL

## 2020-02-25 MED ORDER — AMLODIPINE BESYLATE 10 MG PO TABS
10.0000 mg | ORAL_TABLET | Freq: Every day | ORAL | Status: DC
  Administered 2020-02-25 – 2020-02-26 (×2): 10 mg via ORAL
  Filled 2020-02-25 (×3): qty 1

## 2020-02-25 MED ORDER — MIDAZOLAM HCL 5 MG/5ML IJ SOLN
INTRAMUSCULAR | Status: DC | PRN
  Administered 2020-02-25: 2 mg via INTRAVENOUS

## 2020-02-25 MED ORDER — ROCURONIUM BROMIDE 10 MG/ML (PF) SYRINGE
PREFILLED_SYRINGE | INTRAVENOUS | Status: DC | PRN
  Administered 2020-02-25: 50 mg via INTRAVENOUS
  Administered 2020-02-25 (×2): 20 mg via INTRAVENOUS
  Administered 2020-02-25: 50 mg via INTRAVENOUS

## 2020-02-25 MED ORDER — BENAZEPRIL HCL 10 MG PO TABS
40.0000 mg | ORAL_TABLET | Freq: Every day | ORAL | Status: DC
  Administered 2020-02-25 – 2020-02-26 (×2): 40 mg via ORAL
  Filled 2020-02-25 (×2): qty 4

## 2020-02-25 MED ORDER — FENTANYL CITRATE (PF) 100 MCG/2ML IJ SOLN
INTRAMUSCULAR | Status: AC
  Filled 2020-02-25: qty 2

## 2020-02-25 MED ORDER — DEXAMETHASONE SODIUM PHOSPHATE 10 MG/ML IJ SOLN
INTRAMUSCULAR | Status: AC
  Filled 2020-02-25: qty 1

## 2020-02-25 MED ORDER — LIDOCAINE 2% (20 MG/ML) 5 ML SYRINGE
INTRAMUSCULAR | Status: AC
  Filled 2020-02-25: qty 5

## 2020-02-25 MED ORDER — PROPOFOL 10 MG/ML IV BOLUS
INTRAVENOUS | Status: DC | PRN
  Administered 2020-02-25: 200 mg via INTRAVENOUS

## 2020-02-25 MED ORDER — KETAMINE HCL 10 MG/ML IJ SOLN
INTRAMUSCULAR | Status: AC
  Filled 2020-02-25: qty 1

## 2020-02-25 MED ORDER — SPIRONOLACTONE 25 MG PO TABS
25.0000 mg | ORAL_TABLET | Freq: Every day | ORAL | Status: DC
  Administered 2020-02-25 – 2020-02-26 (×2): 25 mg via ORAL
  Filled 2020-02-25 (×2): qty 1

## 2020-02-25 MED ORDER — SODIUM CHLORIDE (PF) 0.9 % IJ SOLN
INTRAMUSCULAR | Status: DC | PRN
  Administered 2020-02-25: 20 mL

## 2020-02-25 MED ORDER — SUGAMMADEX SODIUM 500 MG/5ML IV SOLN
INTRAVENOUS | Status: DC | PRN
  Administered 2020-02-25: 500 mg via INTRAVENOUS

## 2020-02-25 MED ORDER — DULOXETINE HCL 60 MG PO CPEP
60.0000 mg | ORAL_CAPSULE | Freq: Every day | ORAL | Status: DC
  Administered 2020-02-26: 60 mg via ORAL
  Filled 2020-02-25: qty 1

## 2020-02-25 MED ORDER — SUGAMMADEX SODIUM 500 MG/5ML IV SOLN
INTRAVENOUS | Status: AC
  Filled 2020-02-25: qty 5

## 2020-02-25 MED ORDER — KETAMINE HCL 10 MG/ML IJ SOLN
INTRAMUSCULAR | Status: DC | PRN
  Administered 2020-02-25: 40 mg via INTRAVENOUS

## 2020-02-25 MED ORDER — LACTATED RINGERS IV SOLN: INTRAVENOUS | Status: DC

## 2020-02-25 MED ORDER — LIDOCAINE HCL 2 % IJ SOLN
INTRAMUSCULAR | Status: AC
  Filled 2020-02-25: qty 20

## 2020-02-25 MED ORDER — MAGNESIUM CITRATE PO SOLN: 1.0000 | Freq: Once | ORAL | Status: DC

## 2020-02-25 MED ORDER — LIDOCAINE 2% (20 MG/ML) 5 ML SYRINGE
INTRAMUSCULAR | Status: DC | PRN
  Administered 2020-02-25: 100 mg via INTRAVENOUS
  Administered 2020-02-25: 1.5 mg/kg/h via INTRAVENOUS

## 2020-02-25 MED ORDER — ACETAMINOPHEN 500 MG PO TABS
1000.0000 mg | ORAL_TABLET | Freq: Four times a day (QID) | ORAL | Status: AC
  Administered 2020-02-25 – 2020-02-26 (×4): 1000 mg via ORAL
  Filled 2020-02-25 (×4): qty 2

## 2020-02-25 MED ORDER — SODIUM CHLORIDE 0.45 % IV SOLN: INTRAVENOUS | Status: DC

## 2020-02-25 MED ORDER — ORAL CARE MOUTH RINSE: 15.0000 mL | Freq: Once | OROMUCOSAL | Status: AC

## 2020-02-25 MED ORDER — SODIUM CHLORIDE (PF) 0.9 % IJ SOLN
INTRAMUSCULAR | Status: AC
  Filled 2020-02-25: qty 20

## 2020-02-25 SURGICAL SUPPLY — 68 items
APPLICATOR COTTON TIP 6 STRL (MISCELLANEOUS) ×2 IMPLANT
APPLICATOR COTTON TIP 6IN STRL (MISCELLANEOUS) ×4
CATH FOLEY 2WAY SLVR 18FR 30CC (CATHETERS) ×4 IMPLANT
CATH TIEMANN FOLEY 18FR 5CC (CATHETERS) ×4 IMPLANT
CHLORAPREP W/TINT 26 (MISCELLANEOUS) ×4 IMPLANT
CLIP VESOLOCK LG 6/CT PURPLE (CLIP) ×20 IMPLANT
CNTNR URN SCR LID CUP LEK RST (MISCELLANEOUS) ×2 IMPLANT
CONT SPEC 4OZ STRL OR WHT (MISCELLANEOUS) ×2
COVER SURGICAL LIGHT HANDLE (MISCELLANEOUS) ×4 IMPLANT
COVER TIP SHEARS 8 DVNC (MISCELLANEOUS) ×2 IMPLANT
COVER TIP SHEARS 8MM DA VINCI (MISCELLANEOUS) ×2
COVER WAND RF STERILE (DRAPES) IMPLANT
CUTTER ECHEON FLEX ENDO 45 340 (ENDOMECHANICALS) ×4 IMPLANT
DECANTER SPIKE VIAL GLASS SM (MISCELLANEOUS) ×4 IMPLANT
DERMABOND ADVANCED (GAUZE/BANDAGES/DRESSINGS) ×2
DERMABOND ADVANCED .7 DNX12 (GAUZE/BANDAGES/DRESSINGS) ×2 IMPLANT
DRAIN CHANNEL RND F F (WOUND CARE) ×4 IMPLANT
DRAPE ARM DVNC X/XI (DISPOSABLE) ×8 IMPLANT
DRAPE COLUMN DVNC XI (DISPOSABLE) ×2 IMPLANT
DRAPE DA VINCI XI ARM (DISPOSABLE) ×8
DRAPE DA VINCI XI COLUMN (DISPOSABLE) ×2
DRAPE SURG IRRIG POUCH 19X23 (DRAPES) ×4 IMPLANT
DRSG TEGADERM 4X4.75 (GAUZE/BANDAGES/DRESSINGS) ×4 IMPLANT
ELECT REM PT RETURN 15FT ADLT (MISCELLANEOUS) ×4 IMPLANT
GAUZE 4X4 16PLY RFD (DISPOSABLE) IMPLANT
GAUZE SPONGE 2X2 8PLY STRL LF (GAUZE/BANDAGES/DRESSINGS) IMPLANT
GLOVE BIO SURGEON STRL SZ 6.5 (GLOVE) ×3 IMPLANT
GLOVE BIO SURGEONS STRL SZ 6.5 (GLOVE) ×1
GLOVE BIOGEL M STRL SZ7.5 (GLOVE) ×8 IMPLANT
GLOVE BIOGEL PI IND STRL 7.5 (GLOVE) ×2 IMPLANT
GLOVE BIOGEL PI INDICATOR 7.5 (GLOVE) ×2
GOWN STRL REUS W/TWL LRG LVL3 (GOWN DISPOSABLE) ×12 IMPLANT
HOLDER FOLEY CATH W/STRAP (MISCELLANEOUS) ×4 IMPLANT
IRRIG SUCT STRYKERFLOW 2 WTIP (MISCELLANEOUS) ×4
IRRIGATION SUCT STRKRFLW 2 WTP (MISCELLANEOUS) ×2 IMPLANT
IV LACTATED RINGERS 1000ML (IV SOLUTION) ×4 IMPLANT
KIT PROCEDURE DA VINCI SI (MISCELLANEOUS) ×2
KIT PROCEDURE DVNC SI (MISCELLANEOUS) ×2 IMPLANT
KIT TURNOVER KIT A (KITS) IMPLANT
NEEDLE INSUFFLATION 14GA 120MM (NEEDLE) ×4 IMPLANT
NEEDLE SPNL 22GX7 QUINCKE BK (NEEDLE) ×4 IMPLANT
PACK ROBOTIC CUSTOM UROLOGY (CUSTOM PROCEDURE TRAY) ×4 IMPLANT
PAD POSITIONING PINK XL (MISCELLANEOUS) ×4 IMPLANT
PENCIL SMOKE EVACUATOR (MISCELLANEOUS) IMPLANT
PORT ACCESS TROCAR AIRSEAL 12 (TROCAR) ×2 IMPLANT
PORT ACCESS TROCAR AIRSEAL 5M (TROCAR) ×2
SEAL CANN UNIV 5-8 DVNC XI (MISCELLANEOUS) ×8 IMPLANT
SEAL XI 5MM-8MM UNIVERSAL (MISCELLANEOUS) ×8
SET TRI-LUMEN FLTR TB AIRSEAL (TUBING) ×4 IMPLANT
SOLUTION ELECTROLUBE (MISCELLANEOUS) ×4 IMPLANT
SPONGE GAUZE 2X2 STER 10/PKG (GAUZE/BANDAGES/DRESSINGS)
SPONGE LAP 4X18 RFD (DISPOSABLE) ×4 IMPLANT
STAPLE RELOAD 45 GRN (STAPLE) ×2 IMPLANT
STAPLE RELOAD 45MM GREEN (STAPLE) ×2
SUT ETHILON 3 0 PS 1 (SUTURE) ×4 IMPLANT
SUT MNCRL AB 4-0 PS2 18 (SUTURE) ×8 IMPLANT
SUT PDS AB 1 CT1 27 (SUTURE) ×8 IMPLANT
SUT VIC AB 2-0 SH 27 (SUTURE) ×2
SUT VIC AB 2-0 SH 27X BRD (SUTURE) ×2 IMPLANT
SUT VIC AB 3-0 SH 27 (SUTURE) ×2
SUT VIC AB 3-0 SH 27XBRD (SUTURE) ×2 IMPLANT
SUT VICRYL 0 UR6 27IN ABS (SUTURE) ×4 IMPLANT
SUT VLOC BARB 180 ABS3/0GR12 (SUTURE) ×12
SUTURE VLOC BRB 180 ABS3/0GR12 (SUTURE) ×6 IMPLANT
SYR 27GX1/2 1ML LL SAFETY (SYRINGE) ×4 IMPLANT
TOWEL OR NON WOVEN STRL DISP B (DISPOSABLE) ×4 IMPLANT
TROCAR XCEL NON-BLD 5MMX100MML (ENDOMECHANICALS) IMPLANT
WATER STERILE IRR 1000ML POUR (IV SOLUTION) ×4 IMPLANT

## 2020-02-25 NOTE — Anesthesia Postprocedure Evaluation (Signed)
Anesthesia Post Note  Patient: Cody Green.  Procedure(s) Performed: XI ROBOTIC ASSISTED LAPAROSCOPIC RADICAL PROSTATECTOMY (N/A ) LYMPHADENECTOMY (Bilateral )     Patient location during evaluation: PACU Anesthesia Type: General Level of consciousness: awake and alert Pain management: pain level controlled Vital Signs Assessment: post-procedure vital signs reviewed and stable Respiratory status: spontaneous breathing, nonlabored ventilation and respiratory function stable Cardiovascular status: blood pressure returned to baseline and stable Postop Assessment: no apparent nausea or vomiting Anesthetic complications: no   No complications documented.  Last Vitals:  Vitals:   02/25/20 1245 02/25/20 1316  BP: (!) 159/90 (!) 169/83  Pulse: 97 85  Resp: 13 14  Temp: 36.9 C 36.9 C  SpO2: 100% 100%    Last Pain:  Vitals:   02/25/20 1316  TempSrc: Oral  PainSc:                  Lidia Collum

## 2020-02-25 NOTE — H&P (Signed)
Cody Green. is an 49 y.o. male.    Chief Complaint: Pre-Op Prostatecomy  HPI:   1 - Moderate Risk Prostate Cancer - 4 ROI cors up to 50% Grade 2 cancer (Rt lateral lesions) + 1 core Grade 1 (LMB) and 2 cores Rt lateral by BX 12/2019 on eval PSA 11. TRUS 20mL no median lobe. He does have some obstructive voiding at baseline.   PMH sig for obesity, DM2 (no deficits, A1c 6s). 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 His PCP is Bertis Ruddy with [facility] in [place]   Today " Cody Green" is seen to proceed with prostatectomy. No interval fevers. C19 screen negative.   Past Medical History:  Diagnosis Date  . Anxiety   . Chronic kidney disease   . Depression   . GERD (gastroesophageal reflux disease)   . Hypertension   . Sleep apnea     Past Surgical History:  Procedure Laterality Date  . COLONOSCOPY    . WISDOM TOOTH EXTRACTION      No family history on file. Social History:  reports that he has never smoked. He has never used smokeless tobacco. He reports current alcohol use of about 1.0 standard drink of alcohol per week. He reports that he does not use drugs.  Allergies:  Allergies  Allergen Reactions  . Triamcinolone Acetonide     Other reaction(s): Other (See Comments) nosebleeds    No medications prior to admission.    No results found for this or any previous visit (from the past 48 hour(s)). No results found.  Review of Systems  Constitutional: Negative for fever.  Genitourinary: Positive for frequency.  All other systems reviewed and are negative.   There were no vitals taken for this visit. Physical Exam  HENT:  Head: Normocephalic.  Nose: Nose normal.  Eyes: Pupils are equal, round, and reactive to light.  Cardiovascular: Normal pulses.  Respiratory: Effort normal.  GI:  Stable truncal obesity.   Neurological: He is alert.  Skin: Skin is warm.  Psychiatric: Mood normal.     Assessment/Plan  Proceed as planned with  prostatectomy with node dissection. Risks, benefits, alternatives, expected peri-op course discussed previously and reiterated today.    Alexis Frock, MD 02/25/2020, 5:11 AM

## 2020-02-25 NOTE — Discharge Instructions (Signed)

## 2020-02-25 NOTE — Progress Notes (Signed)
Dr. Tresa Moore paged concerning pt c/o tingling in first three fingers and thumb numbness/tingling.  Passed on in report.

## 2020-02-25 NOTE — Progress Notes (Signed)
S: POD 0 after prostatectomy with nodes. C/o left arm numbness, no weakness in in lateral forearm / hand distribution.  O: NAD, AOx3, Pain controlled with family at bedside Symmetric smile, no focal deficits. RRR Non-labored breathign on RA SNTND, JP serosanguinous, non-foul, Port sites c/d/i [place] with tea colored urine. No LE edema, SCD's in place  Preserved left radial distribution strength, proprioception, and noxios response. No UA swelling. No neck pain.  A/P: Doing well POD 0. 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 Discussed will likely resolve over next few days and encouraged that only sensation affected, and certainly not complete. Further eval if motor beocmes affected.

## 2020-02-25 NOTE — Transfer of Care (Signed)
Immediate Anesthesia Transfer of Care Note  Patient: Cody Green.  Procedure(s) Performed: Procedure(s) with comments: XI ROBOTIC ASSISTED LAPAROSCOPIC RADICAL PROSTATECTOMY (N/A) - 3 HRS LYMPHADENECTOMY (Bilateral)  Patient Location: PACU  Anesthesia Type:General  Level of Consciousness: Patient easily awoken, sedated, comfortable, cooperative, following commands, responds to stimulation.   Airway & Oxygen Therapy: Patient spontaneously breathing, ventilating well, oxygen via simple oxygen mask.  Post-op Assessment: Report given to PACU RN, vital signs reviewed and stable, moving all extremities.   Post vital signs: Reviewed and stable.  Complications: No apparent anesthesia complications  Last Vitals:  Vitals Value Taken Time  BP 141/66 02/25/20 1145  Temp 36.9 C 02/25/20 1145  Pulse 101 02/25/20 1149  Resp 15 02/25/20 1149  SpO2 100 % 02/25/20 1149  Vitals shown include unvalidated device data.  Last Pain:  Vitals:   02/25/20 0729  TempSrc:   PainSc: 0-No pain         Complications: No complications documented.

## 2020-02-25 NOTE — Plan of Care (Signed)
  Problem: Health Behavior/Discharge Planning: Goal: Ability to manage health-related needs will improve Outcome: Progressing   Problem: Clinical Measurements: Goal: Ability to maintain clinical measurements within normal limits will improve Outcome: Progressing Goal: Diagnostic test results will improve Outcome: Progressing   Problem: Activity: Goal: Risk for activity intolerance will decrease Outcome: Progressing   Problem: Pain Managment: Goal: General experience of comfort will improve Outcome: Progressing   Problem: Education: Goal: Knowledge of the procedure and recovery process will improve Outcome: Progressing

## 2020-02-25 NOTE — Brief Op Note (Signed)
02/25/2020  11:27 AM  PATIENT:  Cody Green.  49 y.o. male  PRE-OPERATIVE DIAGNOSIS:  PROSTATE CANCER  POST-OPERATIVE DIAGNOSIS:  PROSTATE CANCER  PROCEDURE:  Procedure(s) with comments: XI ROBOTIC ASSISTED LAPAROSCOPIC RADICAL PROSTATECTOMY (N/A) - 3 HRS LYMPHADENECTOMY (Bilateral)  SURGEON:  Surgeon(s) and Role:    * Alexis Frock, MD - Primary  PHYSICIAN ASSISTANT:   ASSISTANTS: Debbrah Alar PA   ANESTHESIA:   local and general  EBL:  580 mL   BLOOD ADMINISTERED:none  DRAINS: JP to bulb   LOCAL MEDICATIONS USED:  MARCAINE     SPECIMEN:  Source of Specimen:  pelvic lymph nodes; prostatectomy; periprostatic fat  DISPOSITION OF SPECIMEN:  PATHOLOGY  COUNTS:  YES  TOURNIQUET:  * No tourniquets in log *  DICTATION: .Other Dictation: Dictation Number  [id number]  PLAN OF CARE: Admit for overnight observation  PATIENT DISPOSITION:  PACU - hemodynamically stable.   Delay start of Pharmacological VTE agent (>24hrs) due to surgical blood loss or risk of bleeding: yes

## 2020-02-25 NOTE — Op Note (Signed)
NAME: Cody Green, Cody Green MEDICAL RECORD DS:[id number] ACCOUNT [id number] DATE OF BIRTH:February 21, 1971 FACILITY: [facility] LOCATION: Kendell Bane, MD  OPERATIVE REPORT  DATE OF PROCEDURE:  02/25/2020  SURGEON:  Alexis Frock MD  PREOPERATIVE DIAGNOSIS:  Moderate risk prostate cancer.  PROCEDURE: 1.  Robotic-assisted laparoscopic radical prostatectomy. 2.  Bilateral pelvic laminectomy. 3.  Injection of indocyanine green dye for sentinel lymphangiography.  ASSISTANT:  Amanda L. Dancy, PA  ESTIMATED BLOOD LOSS:  500 mL.  COMPLICATIONS:  None.  SPECIMENS: 1.  Periprosthetic fat. 2.  Right external iliac lymph nodes, sentinel. 3.  Right obturator lymph nodes. 4.  Left external iliac lymph nodes, sentinel. 5.  Left obturator lymph nodes, sentinel. 6.  Prostatectomy.  FINDINGS: 1.  Bilateral sentinel lymph nodes denoted on pathology requisition. 2.  Large body habitus, anticipated.  DRAINS:   1.  Jackson-Pratt drain to bulb suction. 2.  Foley catheter to straight drain.  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  He also has some baseline obstructive voiding symptoms already.  Options were  discussed for management, including surveillance protocols versus ablative therapy versus surgical extirpation and he wished to proceed with robotic prostatectomy with curative intent.  Given his large body habitus, he was referred for consideration and  felt to be an adequate candidate.  Informed consent was then placed in the medical record.  DESCRIPTION OF PROCEDURE:  The patient being identified, the procedure being radical prostatectomy was confirmed.  Procedure timeout was performed.  Intravenous antibiotics were administered.  General endotracheal anesthesia induced.  The patient was  placed into a low lithotomy position, sterile field was created,  prepping and draping the patient's penis, perineum and proximal thighs using iodine and his infra-xiphoid abdomen using chlorhexidine gluconate.  He was further fastened to the operative  table using tucking his arms, 3-inch tape over foam padding across the supraxiphoid chest.  A test of steep Trendelenburg positioning was performed.  He was found to be suitably positioned.  Notably, his head was elevated anteriorly prior to induction of  anesthesia to avoid irritation of his C-spine.  A Foley catheter was placed free to straight drain.  Next, a high-flow, low-pressure pneumoperitoneum was obtained using Veress technique in the supraumbilical midline, having passed the aspiration and  drop test.  An 8 mm robotic camera port was then placed in same location.  Laparoscopic examination of the peritoneal cavity revealed significant adhesions, no visceral injury.  Distal ports were placed as follows:  Right paramedian 8 mm robotic port,  right far lateral 12 mm AirSeal assist port, right paramedian 5 mm suction port, left paramedian 8 mm robotic port, left far lateral 8 mm robotic port.  Robot was docked and passed the electronic checks.  Initial attention was directed at development  space of Retzius.  Incision was made lateral to the left median umbilical ligament from the midline towards the area of the internal ring, coursing along the iliac vessels towards the area of the left ureter.  Left vas deferens was encountered, ligated  using medial bucket handle and the left bladder wall was swept away from the pelvic sidewall towards the area of the endopelvic fascia on the left side.  A mirror image dissection was performed on the right side.  Anterior attachments were taken down  with cautery scissors.  This exposed the anterior base of the prostate, which was defatted to  better denote the bladder neck-prostate junction with this fat was set aside labeled as periprosthetic fat.  Next, 0.2 mL of indocyanine green  dye was injected  into each lobe of the prostate using a percutaneously placed robotically-guided spinal needle with intervening suctioning to prevent dye spillage, which did not occur.  Next, the endopelvic fascia was carefully swept away from the lateral aspect of the  prostate in a base to apex orientation to expose the dorsal venous complex was controlled using a green load stapler, taking exquisite care to avoid membranous urethral injury, which did not occur.  Then, approximately 10 minutes post-dye injection, the  pelvis was inspected under near infrared fluorescence light.  Sentinel node angiography revealed excellent parenchymal uptake of the prostate and there were several areas of lymphatic channels within the lymph nodes in the traditional packets.  As such, a standard template lymphadenectomy was performed on the right  side, first the right external iliac group with the boundaries being right external iliac artery, vein, pelvic sidewall, iliac bifurcation.  Lymphostasis was achieved with cold clips, set aside labeled right external iliac lymph nodes, sentinel.   Next,  the right obturator group was dissected free with the boundaries being right external iliac vein, pelvic sidewall, obturator nerve.  Lymphostasis was achieved with cold clips, set aside labeled right obturator lymph nodes.  A mirror image  lymphadenectomy was performed on the left side of the left external iliac and left obturator groups respectively.  Bilateral obturator nerves were inspected following maneuvers and found to be uninjured.  Attention was then directed at bladder neck  dissection.  Bladder neck was identified by moving the Foley catheter back and forth and a lateral release was performed to better denote the bladder neck-prostate junction, which was then separated in an anterior, posterior direction, keeping what  appeared to be a rim of circular muscle fibers at each plane of dissection.  The left lobe of the  prostate was somewhat asymmetric and therefore, the bladder neck was somewhat asymmetric with a widened left side.  Posterior dissection was performed by  incising approximately 7 mm inferoposterior to the posterior lip of the prostate, entering the plane of Denonvilliers.  Bilateral vas deferens were encountered, dissected for a distance of approximately 4 cm, ligated and placed on gentle superior  traction.  Bilateral seminal vesicles were dissected to their tips and placed on gentle superior traction.  Dissection proceeded within this posterior space towards the apex of the prostate.  This exposed the vascular pedicles on each side, which were  controlled using a sequential clipping technique in a base to apex orientation.  A purposeful wide dissection was performed on the right side given the predominance of right lateral tumor with closer dissection on the left.  Final apical dissection was  performed of the anterior plane, placing the prostate on gentle superior traction, transecting the membranous urethra coldly.  This completely freed up the prostatectomy specimen, which was placed into an EndoCatch bag for later retrieval.  Next, digital  rectal exam was performed using indicator glove under laparoscopic vision.  No evidence of rectal violation was noted.  Posterior reconstruction was performed by reapproximating the posterior urethral plate to the posterior bladder neck using 3-0 V-Loc  suture.  Next, a 3-0 Vicryl suture was used to perform bladder neck tailoring on the left side to better reapproximate circular caliber and bladder neck and mucosa-to-mucosa anastomosis was performed using double arm 3-0 V-Loc suture from the 6 o'clock,  12 o'clock  position.  A Foley catheter was placed, which irrigated quantitatively.  Sponge, needle counts were correct.  Hemostasis appeared excellent.  A closed suction drain was brought to the previous left lateral most robotic port site through to the  peritoneal  cavity.  The previous right lateral assistant port site was closed at the level of the fascia using Carter-Thomason suture passer and a 0 Vicryl.  All incision sites were infiltrated with dilute lipolyzed Marcaine and closed at the level of  the skin using subcuticular Monocryl and Dermabond.  Procedure was t hen terminated.  The patient tolerated the procedure well.  No immediate perinatal complications.  The patient was taken to postanesthesia care in stable condition with plan for  observation admission.    Please note, first assistant Debbrah Alar was crucial for all portions of surgery today.  She provided invaluable retraction, suctioning, specimen manipulation, vascular stapling, lymphatic clipping and general first assistance.  VN/NUANCE  D:02/25/2020 T:02/25/2020 JOB:[id number]

## 2020-02-25 NOTE — Anesthesia Procedure Notes (Signed)
Procedure Name: Intubation Date/Time: 02/25/2020 8:37 AM Performed by: Deliah Boston, CRNA Pre-anesthesia Checklist: Patient identified, Emergency Drugs available, Suction available and Patient being monitored Patient Re-evaluated:Patient Re-evaluated prior to induction Oxygen Delivery Method: Circle system utilized Preoxygenation: Pre-oxygenation with 100% oxygen Induction Type: IV induction Ventilation: Mask ventilation without difficulty Laryngoscope Size: Glidescope and 4 Grade View: Grade I Tube type: Oral Tube size: 7.5 mm Number of attempts: 1 Airway Equipment and Method: Stylet,  Oral airway,  Video-laryngoscopy and Rigid stylet Placement Confirmation: ETT inserted through vocal cords under direct vision,  positive ETCO2 and breath sounds checked- equal and bilateral Secured at: 24 cm Tube secured with: Tape Dental Injury: Teeth and Oropharynx as per pre-operative assessment

## 2020-02-26 ENCOUNTER — Encounter (HOSPITAL_COMMUNITY): Payer: Self-pay | Admitting: Urology

## 2020-02-26 DIAGNOSIS — C61 Malignant neoplasm of prostate: Secondary | ICD-10-CM | POA: Diagnosis not present

## 2020-02-26 LAB — GLUCOSE, CAPILLARY
Glucose-Capillary: 153 mg/dL — ABNORMAL HIGH (ref 70–99)
Glucose-Capillary: 165 mg/dL — ABNORMAL HIGH (ref 70–99)
Glucose-Capillary: 173 mg/dL — ABNORMAL HIGH (ref 70–99)

## 2020-02-26 LAB — BASIC METABOLIC PANEL
Anion gap: 8 (ref 5–15)
BUN: 16 mg/dL (ref 6–20)
CO2: 25 mmol/L (ref 22–32)
Calcium: 8.7 mg/dL — ABNORMAL LOW (ref 8.9–10.3)
Chloride: 97 mmol/L — ABNORMAL LOW (ref 98–111)
Creatinine, Ser: 1.06 mg/dL (ref 0.61–1.24)
GFR calc Af Amer: >60 mL/min (ref >60)
GFR calc non Af Amer: >60 mL/min (ref >60)
Glucose, Bld: 154 mg/dL — ABNORMAL HIGH (ref 70–99)
Potassium: 4.6 mmol/L (ref 3.5–5.1)
Sodium: 130 mmol/L — ABNORMAL LOW (ref 135–145)

## 2020-02-26 LAB — HEMOGLOBIN AND HEMATOCRIT, BLOOD
HCT: 34.2 % — ABNORMAL LOW (ref 39.0–52.0)
Hemoglobin: 11.8 g/dL — ABNORMAL LOW (ref 13.0–17.0)

## 2020-02-26 LAB — CREATININE, FLUID (PLEURAL, PERITONEAL, JP DRAINAGE): Creat, Fluid: 0.9 mg/dL

## 2020-02-26 MED ORDER — CHLORHEXIDINE GLUCONATE CLOTH 2 % EX PADS
6.0000 | MEDICATED_PAD | Freq: Every day | CUTANEOUS | Status: DC
  Administered 2020-02-26: 6 via TOPICAL

## 2020-02-26 NOTE — Discharge Summary (Signed)
Physician Discharge Summary  Patient ID: Cody Green. MRN: [id number] DOB/AGE: 1971-06-02 49 y.o.  Admit date: 02/25/2020 Discharge date: 02/26/2020  Admission Diagnoses: Prostate Cancer  Discharge Diagnoses:  Active Problems:   Prostate cancer [facility])   Discharged Condition: good  Hospital Course: Pt underwent robotic radical prostatectomy with node dissection on 02/25/20, the day of admission, without acute complication. He was observed on 4th floor Urology service post-op where he began his vigorous recovery. He was noted to have moderate left radial nerve distribution sensory only neuropraxia POD 0 with preserved noxious sensation and proprioception and full motor strength, improving some at discharge. By the afternoon of POD 1, the day of discharge, he is ambulatory, pain controlled on PO meds, maintaining PO hydration/nutrition, and felt to be adequate for discharge. JP removed as Cr same as serum. Discharge Hgb 11.8, Final path pending.   Consults: None  Significant Diagnostic Studies: labs: as per above  Treatments: surgery: as per above  Discharge Exam: Blood pressure 139/78, pulse 100, temperature 98.7 F (37.1 C), temperature source Oral, resp. rate 18, height 6' 1"  (1.854 m), weight (!) 157.9 kg, SpO2 98 %. General appearance: alert, cooperative, appears stated age and wife at bedside.  Eyes: negative Nose: Nares normal. Septum midline. Mucosa normal. No drainage or sinus tenderness. Throat: lips, mucosa, and tongue normal; teeth and gums normal Neck: supple, symmetrical, trachea midline Back: symmetric, no curvature. ROM normal. No CVA tenderness. Resp: non-labored on room air.  Cardio: Nl rate GI: soft, non-tender; bowel sounds normal; no masses,  no organomegaly Male genitalia: normal, foley in place wiht thin medium yellow urine.  Extremities: extremities normal, atraumatic, no cyanosis or edema and preserved left UE strength, proprioception, noxious sensation but  decreased non-noxious along radial distribution.  Skin: Skin color, texture, turgor normal. No rashes or lesions Lymph nodes: Cervical, supraclavicular, and axillary nodes normal. Neurologic: Grossly normal  Disposition:    Allergies as of 02/26/2020      Reactions   Triamcinolone Acetonide    Other reaction(s): Other (See Comments) nosebleeds      Medication List    STOP taking these medications   Vitamin D3 50 MCG (2000 UT) Tabs     TAKE these medications   amLODipine-benazepril 10-40 MG capsule Commonly known as: LOTREL Take 1 capsule by mouth daily.   atorvastatin 20 MG tablet Commonly known as: LIPITOR Take 20 mg by mouth daily.   B-D ULTRAFINE III SHORT PEN 31G X 8 MM Misc Generic drug: Insulin Pen Needle See admin instructions.   clobetasol ointment 0.05 % Commonly known as: TEMOVATE Apply 1 application topically 2 (two) times daily.   DULoxetine 60 MG capsule Commonly known as: CYMBALTA Take 60 mg by mouth daily.   HYDROcodone-acetaminophen 5-325 MG tablet Commonly known as: Norco Take 1-2 tablets by mouth every 6 (six) hours as needed for moderate pain.   magnesium oxide 400 MG tablet Commonly known as: MAG-OX Take 400 mg by mouth daily.   metFORMIN 500 MG tablet Commonly known as: GLUCOPHAGE Take 500 mg by mouth 4 (four) times daily.   omeprazole 40 MG capsule Commonly known as: PRILOSEC Take 40 mg by mouth daily.   OneTouch Verio Flex System w/Device Kit See admin instructions.   potassium chloride 10 MEQ tablet Commonly known as: KLOR-CON Take 10 mEq by mouth daily.   Precision QID Test test strip Generic drug: glucose blood Use 1 each (1 strip total) once daily Use as instructed.   spironolactone 25 MG  tablet Commonly known as: ALDACTONE Take 25 mg by mouth daily.   sulfamethoxazole-trimethoprim 800-160 MG tablet Commonly known as: BACTRIM DS Take 1 tablet by mouth 2 (two) times daily. Start the day prior to foley removal  appointment   Victoza 18 MG/3ML Sopn Generic drug: liraglutide Inject 1.8 mg into the skin daily.       Follow-up Information    Alexis Frock, MD On 03/08/2020.   Specialty: Urology Why: at 9:15 AM for MD visit and office catheter removal.  Contact information: [address] [place] [address] [phone]               Signed: Alexis Frock 02/26/2020, 4:08 PM

## 2020-02-26 NOTE — Progress Notes (Signed)
Patient's IV removed.  Site WNL.  AVS reviewed with patient and patient's wife.  Verbalized understanding of discharge instructions, physician follow-up, foley care, emptying, leg bag, when to call the MD.  Patient transported by NT via wheelchair to main entrance at discharge.  Patient stable at time of discharge.

## 2020-03-03 LAB — SURGICAL PATHOLOGY

## 2020-10-14 IMAGING — CR DG ABDOMEN 1V
1 series · 2 of 2 positions shown · non-contrast
Comparison: Abdomen and pelvis CT dated 07/30/2019.

CLINICAL DATA: Hematuria.  History of nephrolithiasis.

EXAM:
ABDOMEN - 1 VIEW

[Series 1: dg abd 1 view · 0.14mm/px · 2 of 2 slices shown]
[im 1/2]
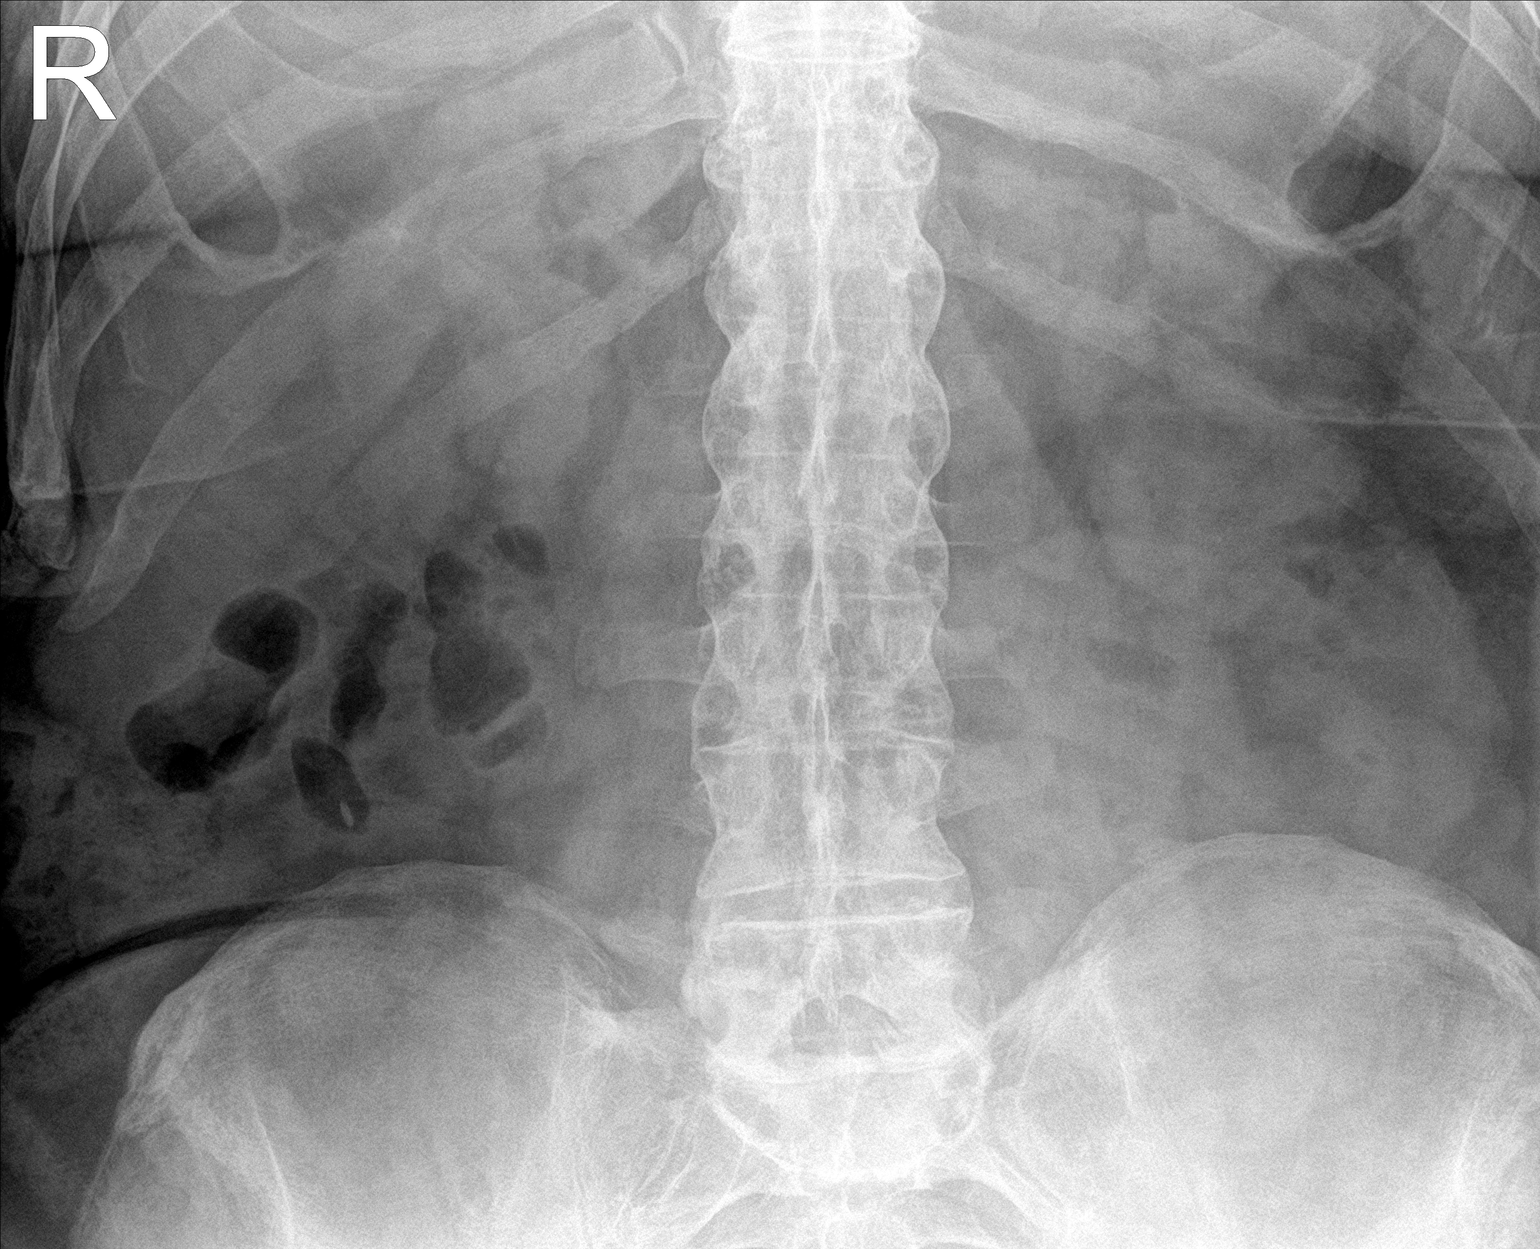
[im 2/2]
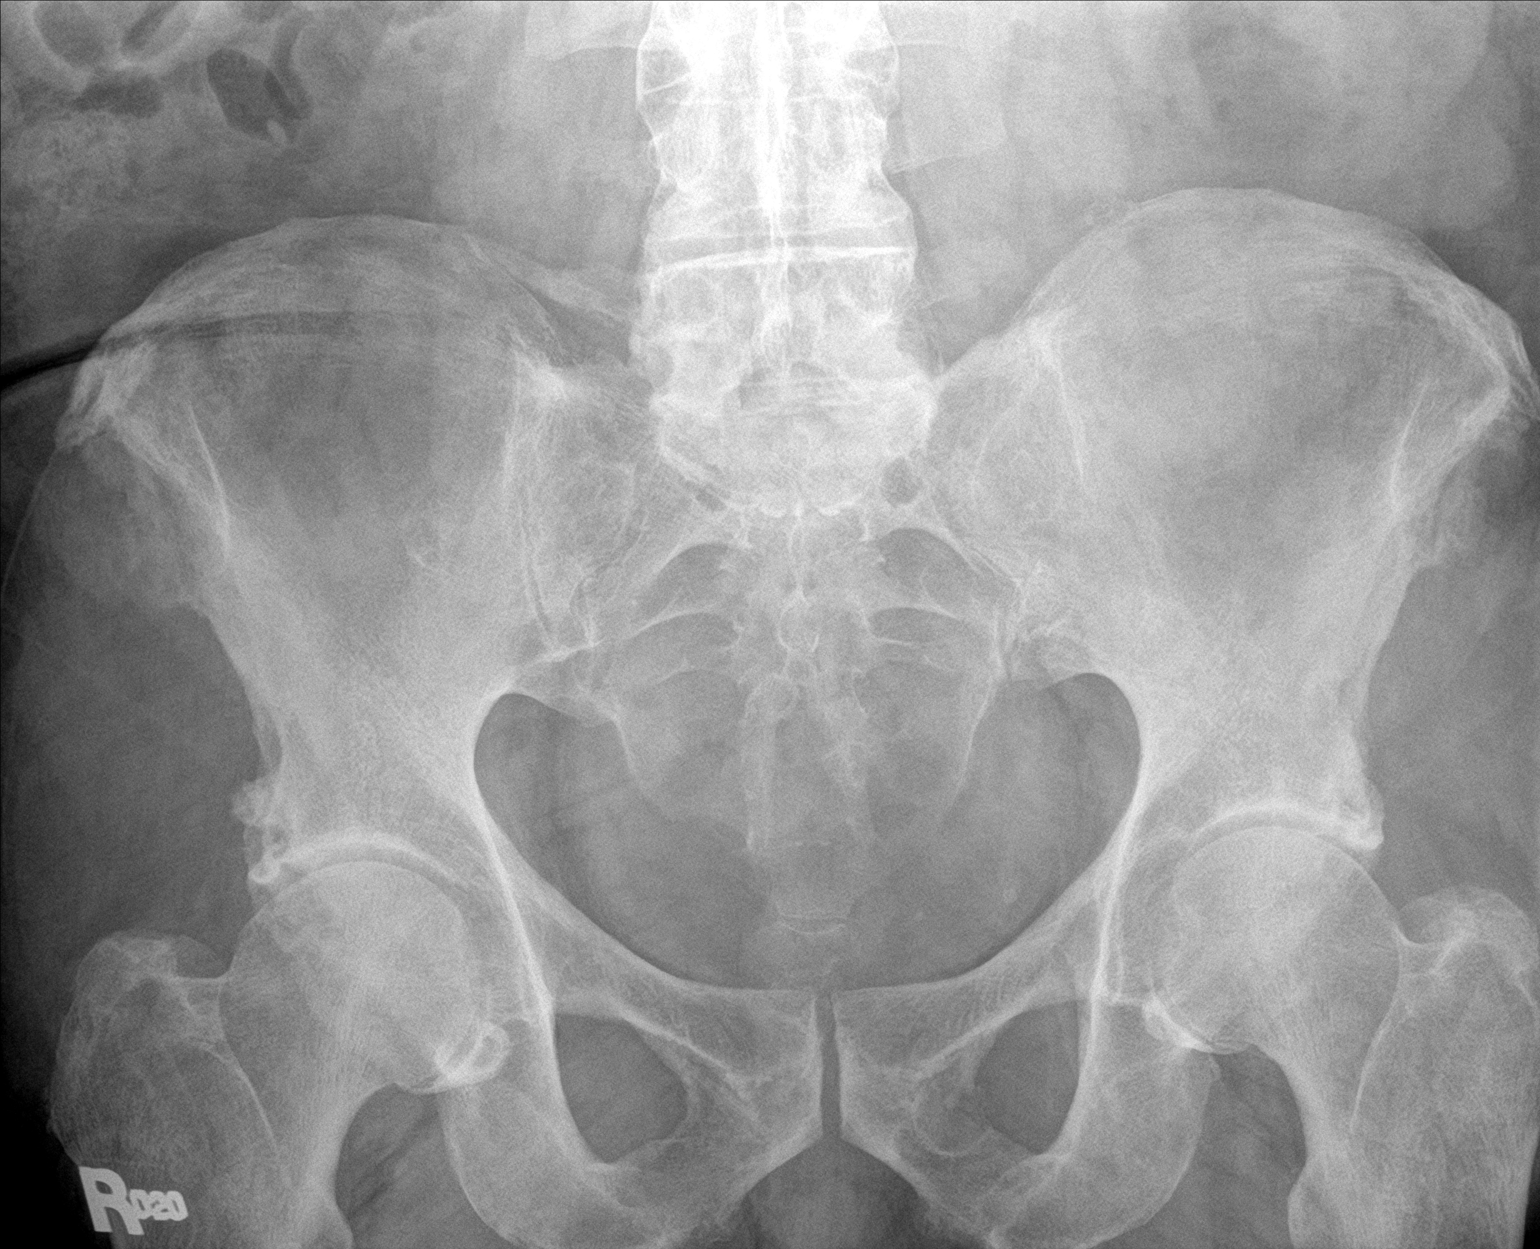

[2 of 2 positions shown; findings below may reference images not displayed]

FINDINGS: Normal bowel gas pattern. Stable two small left inferior pelvic
phleboliths. Possible small, faintly visualized calculus in the
lower pole of the right kidney measuring 4 mm in diameter.
Otherwise, no radiographically visible calcified urinary tract
calculi. Lumbar and lower thoracic spine degenerative changes with
fused, flowing osteophytes, compatible with changes of DISH
IMPRESSION: Possible 4 mm lower pole right renal calculus. No acute abnormality.

## 2022-07-20 ENCOUNTER — Other Ambulatory Visit: Payer: Self-pay | Admitting: Family Medicine

## 2022-07-20 ENCOUNTER — Ambulatory Visit
Admission: RE | Admit: 2022-07-20 | Discharge: 2022-07-20 | Disposition: A | Discharge status: Home / Self Care | Payer: 59 | Attending: Family Medicine | Admitting: Family Medicine

## 2022-07-20 ENCOUNTER — Ambulatory Visit
Admission: RE | Admit: 2022-07-20 | Discharge: 2022-07-20 | Disposition: A | Discharge status: Home / Self Care | Payer: 59 | Source: Ambulatory Visit | Attending: Family Medicine | Admitting: Family Medicine

## 2022-07-20 DIAGNOSIS — M25551 Pain in right hip: Secondary | ICD-10-CM

## 2024-04-23 ENCOUNTER — Other Ambulatory Visit: Payer: Self-pay | Admitting: Medical Genetics

## 2024-04-25 ENCOUNTER — Other Ambulatory Visit
Admission: RE | Admit: 2024-04-25 | Discharge: 2024-04-25 | Disposition: A | Discharge status: Home / Self Care | Payer: Self-pay | Source: Ambulatory Visit | Attending: Medical Genetics | Admitting: Medical Genetics

## 2024-05-05 LAB — GENECONNECT MOLECULAR SCREEN: Genetic Analysis Overall Interpretation: NEGATIVE
